# Patient Record
Sex: Female | Born: 1935 | Race: White | Hispanic: No | Marital: Married | Smoking: Never smoker
Health system: Southern US, Community
[De-identification: ages and names within clinical notes are randomized; demographics above are authoritative.]

## PROBLEM LIST (undated history)

## (undated) DIAGNOSIS — K8020 Calculus of gallbladder without cholecystitis without obstruction: Secondary | ICD-10-CM

## (undated) DIAGNOSIS — G8929 Other chronic pain: Secondary | ICD-10-CM

## (undated) DIAGNOSIS — I10 Essential (primary) hypertension: Secondary | ICD-10-CM

## (undated) DIAGNOSIS — M545 Low back pain, unspecified: Secondary | ICD-10-CM

## (undated) DIAGNOSIS — K219 Gastro-esophageal reflux disease without esophagitis: Secondary | ICD-10-CM

## (undated) DIAGNOSIS — B029 Zoster without complications: Secondary | ICD-10-CM

## (undated) DIAGNOSIS — R3129 Other microscopic hematuria: Secondary | ICD-10-CM

## (undated) DIAGNOSIS — Z86010 Personal history of colonic polyps: Secondary | ICD-10-CM

## (undated) DIAGNOSIS — D131 Benign neoplasm of stomach: Secondary | ICD-10-CM

## (undated) DIAGNOSIS — E785 Hyperlipidemia, unspecified: Secondary | ICD-10-CM

## (undated) DIAGNOSIS — K644 Residual hemorrhoidal skin tags: Secondary | ICD-10-CM

## (undated) DIAGNOSIS — M8580 Other specified disorders of bone density and structure, unspecified site: Secondary | ICD-10-CM

## (undated) DIAGNOSIS — K5790 Diverticulosis of intestine, part unspecified, without perforation or abscess without bleeding: Secondary | ICD-10-CM

## (undated) DIAGNOSIS — K449 Diaphragmatic hernia without obstruction or gangrene: Secondary | ICD-10-CM

## (undated) DIAGNOSIS — N393 Stress incontinence (female) (male): Secondary | ICD-10-CM

## (undated) DIAGNOSIS — M1990 Unspecified osteoarthritis, unspecified site: Secondary | ICD-10-CM

## (undated) HISTORY — DX: Personal history of colonic polyps: Z86.010

## (undated) HISTORY — DX: Other microscopic hematuria: R31.29

## (undated) HISTORY — DX: Low back pain: M54.5

## (undated) HISTORY — DX: Zoster without complications: B02.9

## (undated) HISTORY — PX: UPPER GASTROINTESTINAL ENDOSCOPY: SHX188

## (undated) HISTORY — DX: Calculus of gallbladder without cholecystitis without obstruction: K80.20

## (undated) HISTORY — DX: Other chronic pain: G89.29

## (undated) HISTORY — DX: Stress incontinence (female) (male): N39.3

## (undated) HISTORY — PX: LAPAROSCOPIC CHOLECYSTECTOMY: SUR755

## (undated) HISTORY — DX: Other specified disorders of bone density and structure, unspecified site: M85.80

## (undated) HISTORY — DX: Essential (primary) hypertension: I10

## (undated) HISTORY — PX: ABDOMINAL HYSTERECTOMY: SHX81

## (undated) HISTORY — DX: Diverticulosis of intestine, part unspecified, without perforation or abscess without bleeding: K57.90

## (undated) HISTORY — PX: BREAST EXCISIONAL BIOPSY: SUR124

## (undated) HISTORY — DX: Diaphragmatic hernia without obstruction or gangrene: K44.9

## (undated) HISTORY — DX: Unspecified osteoarthritis, unspecified site: M19.90

## (undated) HISTORY — DX: Low back pain, unspecified: M54.50

## (undated) HISTORY — DX: Benign neoplasm of stomach: D13.1

## (undated) HISTORY — PX: COLONOSCOPY: SHX5424

## (undated) HISTORY — DX: Residual hemorrhoidal skin tags: K64.4

## (undated) HISTORY — PX: OTHER SURGICAL HISTORY: SHX169

## (undated) HISTORY — DX: Hyperlipidemia, unspecified: E78.5

## (undated) HISTORY — DX: Gastro-esophageal reflux disease without esophagitis: K21.9

---

## 1978-06-07 DIAGNOSIS — B029 Zoster without complications: Secondary | ICD-10-CM

## 1978-06-07 HISTORY — DX: Zoster without complications: B02.9

## 1997-12-21 ENCOUNTER — Inpatient Hospital Stay (HOSPITAL_COMMUNITY): Admission: EM | Admit: 1997-12-21 | Discharge: 1997-12-22 | Payer: Self-pay | Admitting: Emergency Medicine

## 2000-01-22 ENCOUNTER — Other Ambulatory Visit: Admission: RE | Admit: 2000-01-22 | Discharge: 2000-01-22 | Payer: Self-pay

## 2002-06-20 ENCOUNTER — Inpatient Hospital Stay (HOSPITAL_COMMUNITY): Admission: RE | Admit: 2002-06-20 | Discharge: 2002-06-21 | Payer: Self-pay

## 2002-09-11 ENCOUNTER — Encounter: Payer: Self-pay | Admitting: Family Medicine

## 2002-09-11 ENCOUNTER — Encounter: Admission: RE | Admit: 2002-09-11 | Discharge: 2002-09-11 | Payer: Self-pay | Admitting: Family Medicine

## 2005-09-20 ENCOUNTER — Ambulatory Visit: Payer: Self-pay | Admitting: Internal Medicine

## 2005-09-24 ENCOUNTER — Ambulatory Visit: Payer: Self-pay | Admitting: Internal Medicine

## 2010-11-06 ENCOUNTER — Ambulatory Visit
Admission: RE | Admit: 2010-11-06 | Discharge: 2010-11-06 | Disposition: A | Discharge status: Home / Self Care | Payer: Medicare Other | Source: Ambulatory Visit | Attending: Cardiovascular Disease | Admitting: Cardiovascular Disease

## 2010-11-06 ENCOUNTER — Other Ambulatory Visit: Payer: Self-pay | Admitting: Cardiovascular Disease

## 2010-11-06 MED ORDER — IOHEXOL 300 MG/ML  SOLN
100.0000 mL | Freq: Once | INTRAMUSCULAR | Status: AC | PRN
  Administered 2010-11-06: 100 mL via INTRAVENOUS

## 2011-06-08 DIAGNOSIS — D131 Benign neoplasm of stomach: Secondary | ICD-10-CM

## 2011-06-08 HISTORY — DX: Benign neoplasm of stomach: D13.1

## 2011-06-16 DIAGNOSIS — Z1231 Encounter for screening mammogram for malignant neoplasm of breast: Secondary | ICD-10-CM | POA: Diagnosis not present

## 2011-06-18 DIAGNOSIS — M949 Disorder of cartilage, unspecified: Secondary | ICD-10-CM | POA: Diagnosis not present

## 2011-06-18 DIAGNOSIS — I10 Essential (primary) hypertension: Secondary | ICD-10-CM | POA: Diagnosis not present

## 2011-06-18 DIAGNOSIS — M899 Disorder of bone, unspecified: Secondary | ICD-10-CM | POA: Diagnosis not present

## 2011-06-18 DIAGNOSIS — E785 Hyperlipidemia, unspecified: Secondary | ICD-10-CM | POA: Diagnosis not present

## 2011-06-18 DIAGNOSIS — R82998 Other abnormal findings in urine: Secondary | ICD-10-CM | POA: Diagnosis not present

## 2011-06-18 DIAGNOSIS — R7301 Impaired fasting glucose: Secondary | ICD-10-CM | POA: Diagnosis not present

## 2011-06-25 DIAGNOSIS — E785 Hyperlipidemia, unspecified: Secondary | ICD-10-CM | POA: Diagnosis not present

## 2011-06-25 DIAGNOSIS — I10 Essential (primary) hypertension: Secondary | ICD-10-CM | POA: Diagnosis not present

## 2011-06-25 DIAGNOSIS — Z124 Encounter for screening for malignant neoplasm of cervix: Secondary | ICD-10-CM | POA: Diagnosis not present

## 2011-06-25 DIAGNOSIS — Z0000 Encounter for general adult medical examination without abnormal findings: Secondary | ICD-10-CM | POA: Diagnosis not present

## 2011-06-25 DIAGNOSIS — Z1239 Encounter for other screening for malignant neoplasm of breast: Secondary | ICD-10-CM | POA: Diagnosis not present

## 2011-06-25 DIAGNOSIS — R7301 Impaired fasting glucose: Secondary | ICD-10-CM | POA: Diagnosis not present

## 2011-06-28 DIAGNOSIS — Z1212 Encounter for screening for malignant neoplasm of rectum: Secondary | ICD-10-CM | POA: Diagnosis not present

## 2011-06-29 DIAGNOSIS — Z23 Encounter for immunization: Secondary | ICD-10-CM | POA: Diagnosis not present

## 2011-06-30 DIAGNOSIS — H26499 Other secondary cataract, unspecified eye: Secondary | ICD-10-CM | POA: Diagnosis not present

## 2011-07-19 ENCOUNTER — Encounter: Payer: Self-pay | Admitting: Internal Medicine

## 2011-07-30 ENCOUNTER — Telehealth: Payer: Self-pay | Admitting: Internal Medicine

## 2011-07-30 NOTE — Telephone Encounter (Signed)
Left message for patient to call back  

## 2011-07-30 NOTE — Telephone Encounter (Signed)
Patient is scheduled with Willette Cluster RNP for 08/02/11

## 2011-08-02 ENCOUNTER — Ambulatory Visit (INDEPENDENT_AMBULATORY_CARE_PROVIDER_SITE_OTHER): Payer: Medicare Other | Admitting: Nurse Practitioner

## 2011-08-02 ENCOUNTER — Encounter: Payer: Self-pay | Admitting: Nurse Practitioner

## 2011-08-02 VITALS — BP 152/88 | HR 78 | Ht 64.0 in | Wt 129.6 lb

## 2011-08-02 DIAGNOSIS — K219 Gastro-esophageal reflux disease without esophagitis: Secondary | ICD-10-CM | POA: Diagnosis not present

## 2011-08-02 DIAGNOSIS — R079 Chest pain, unspecified: Secondary | ICD-10-CM | POA: Diagnosis not present

## 2011-08-02 DIAGNOSIS — R0789 Other chest pain: Secondary | ICD-10-CM

## 2011-08-02 NOTE — Patient Instructions (Signed)
We have scheduled the Endoscopy with Dr. Stan Head.                                                                                                               Upper Endoscopy Upper GI endoscopy means using a flexible scope to look at the esophagus, stomach, and upper small bowel. This is done to make a diagnosis in people with heartburn, abdominal pain, or abnormal bleeding. Sometimes an endoscope is needed to remove foreign bodies or food that become stuck in the esophagus; it can also be used to take biopsy samples. For the best results, do not eat or drink for 8 hours before having your upper endoscopy.  To perform the endoscopy, you will probably be sedated and your throat will be numbed with a special spray. The endoscope is then slowly passed down your throat (this will not interfere with your breathing). An endoscopy exam takes 15 to 30 minutes to complete and there is no real pain. Patients rarely remember much about the procedure. The results of the test may take several days if a biopsy or other test is taken.  You may have a sore throat after an endoscopy exam. Serious complications are very rare. Stick to liquids and soft foods until your pain is better. Do not drive a car or operate any dangerous equipment for at least 24 hours after being sedated. SEEK IMMEDIATE MEDICAL CARE IF:   You have severe throat pain.   You have shortness of breath.   You have bleeding problems.   You have a fever.   You have difficulty recovering from your sedation.  Document Released: 07/01/2004 Document Revised: 02/03/2011 Document Reviewed: 05/26/2008 [facility] Patient Information 2012 [place], [place].

## 2011-08-02 NOTE — Progress Notes (Signed)
08/03/2011 Julia Bush [id number] 1929/07/05   HISTORY OF PRESENT ILLNESS: Patient is a 78 year old female who had a screening colonoscopy by Dr. Leone Payor September 2004 with findings of diverticulosis and external hemorrhoids.  She is here with a five-week history of almost constant chest tightness with frequent episodes of sharp chest pain radiating through to back. The sharp pain sometimes occurs just after a meal, other times several hours later. She does not have this pain every day but does have it frequently.  No associated shortness of breath.  Patient has a longstanding history of GERD. An upper endoscopy April 2007, done for evaluation of chest pain / epigastric pain, revealed mild to moderate nonerosive gastritis, a mildly tortuous esophagus and a small hiatal hernia. With her history of GERD it isn't unusual for her to get radiating chest pain but the belching is a new problem.  Patient had chest pain in June 2012 and was evaluated by cardiology (Dr. Allyson Sabal).  She reports a normal stress test. I reviewed her CT angiography of the chest and abdomen done 11/06/10, it was normal.   PCP tried changing patient from Prilosec, which she had been on for years, to Dexilant but it "tore up" her stomach. She was then started on Nexium, symptoms improved until mid January.  Past Medical History  Diagnosis Date  . Hypertension   . Gallstones   . GERD (gastroesophageal reflux disease)    Past Surgical History  Procedure Date  . Laparoscopic cholecystectomy   . Colonoscopy   . Upper gastrointestinal endoscopy   . Abdominal hysterectomy     reports that she has never smoked. She has never used smokeless tobacco. She reports that she does not drink alcohol or use illicit drugs. family history includes Breast cancer in her mother. No Known Allergies    Outpatient Encounter Prescriptions as of 08/02/2011  Medication Sig Dispense Refill  . aspirin 81 MG tablet Take 81 mg by mouth daily.        . Calcium Citrate-Vitamin D (CITRACAL + D PO) Take 600 mg by mouth daily.      . Ergocalciferol (VITAMIN D2) 2000 UNITS TABS Take by mouth.      . esomeprazole (NEXIUM) 40 MG capsule Take 40 mg by mouth daily before breakfast.      . hydrochlorothiazide (HYDRODIURIL) 25 MG tablet Take 25 mg by mouth daily.      . OMEGA-3 1000 MG CAPS Take by mouth.      . potassium chloride (KLOR-CON) 20 MEQ packet Take 20 mEq by mouth 2 (two) times daily.      . pravastatin (PRAVACHOL) 40 MG tablet Take 40 mg by mouth daily.      Marland Kitchen zolpidem (AMBIEN) 5 MG tablet Take 5 mg by mouth at bedtime as needed. 1/2 tap QHS         REVIEW OF SYSTEMS  : Positive for loss of appetite, dry cough. All other systems reviewed and negative except where noted in the History of Present Illness.   PHYSICAL EXAM: BP 152/88  Pulse 78  Ht 5\' 4"  (1.626 m)  Wt 129 lb 9.6 oz (58.786 kg)  BMI 22.25 kg/m2  SpO2 98% General: Well developed white female in no acute distress Head: Normocephalic and atraumatic Eyes:  sclerae anicteric,conjunctive pink. Ears: Normal auditory acuity Mouth: No deformity or lesions Neck: Supple, no masses.  Lungs: Clear throughout to auscultation Heart: Regular rate and rhythm; no murmurs heard Abdomen: Soft, non distended, nontender. No masses or hepatomegaly  noted. Normal Bowel sounds Rectal: Not done Musculoskeletal: Symmetrical with no gross deformities  Skin: No lesions on visible extremities Extremities: No edema or deformities noted Neurological: Alert oriented x 4, grossly nonfocal Cervical Nodes:  No significant cervical adenopathy Psychological:  Alert and cooperative. Normal mood and affect  ASSESSMENT AND PLAN;  Five-week history of nonexertional chest pain radiating through to the back, variable relationship to meals. Pain refractory to Prilosec and Nexium. Similar symptoms in June 2012, cardiac workup with Dr. Allyson Sabal was negative. This could be esophageal spasms. Esophagitis seems  less likely in the setting of chronic PPIs. Musculoskeletal pain a possibility. To start with, patient will undergo upper endoscopy with Dr. Leone Payor.The benefits, risks, and potential complications of EGD with possible biopsies and/or dilation were discussed with the patient and she agrees to proceed. For now patient will continue daily Nexium. Further recommendation pending results of upper endoscopy.

## 2011-08-03 DIAGNOSIS — R1013 Epigastric pain: Secondary | ICD-10-CM | POA: Insufficient documentation

## 2011-08-03 DIAGNOSIS — K219 Gastro-esophageal reflux disease without esophagitis: Secondary | ICD-10-CM | POA: Insufficient documentation

## 2011-08-04 NOTE — Progress Notes (Signed)
It's probably worth checking LFTs to r/o biliary source for pain.  Reviewed and agree with management. Barbette Hair. Arlyce Dice, M.D., [facility]

## 2011-08-10 ENCOUNTER — Ambulatory Visit (AMBULATORY_SURGERY_CENTER): Payer: Medicare Other | Admitting: Internal Medicine

## 2011-08-10 ENCOUNTER — Encounter: Payer: Self-pay | Admitting: Internal Medicine

## 2011-08-10 VITALS — BP 148/81 | HR 107 | Temp 96.8°F | Resp 18 | Ht 64.0 in | Wt 129.0 lb

## 2011-08-10 DIAGNOSIS — R079 Chest pain, unspecified: Secondary | ICD-10-CM

## 2011-08-10 DIAGNOSIS — K219 Gastro-esophageal reflux disease without esophagitis: Secondary | ICD-10-CM

## 2011-08-10 DIAGNOSIS — D131 Benign neoplasm of stomach: Secondary | ICD-10-CM

## 2011-08-10 MED ORDER — SODIUM CHLORIDE 0.9 % IV SOLN: 500.0000 mL | INTRAVENOUS | Status: DC

## 2011-08-10 MED ORDER — SUCRALFATE 1 G PO TABS: 1.0000 g | ORAL_TABLET | Freq: Three times a day (TID) | ORAL | Status: DC

## 2011-08-10 NOTE — Progress Notes (Signed)
Discussion in endoscopy recovery - getting pain hours after she eats. Has awakened her. Will order abdominal US to look for biliary problems.

## 2011-08-10 NOTE — Op Note (Signed)
[facility] [address] [place], [place]  [address]  ENDOSCOPY PROCEDURE REPORT  PATIENT:  Julia Bush, Julia Bush  MR#:  [id number] BIRTHDATE:  05-Jun-1930, 81 yrs. old  GENDER:  female  ENDOSCOPIST:  Iva Boop, MD, [facility]  PROCEDURE DATE:  08/10/2011 PROCEDURE:  EGD with biopsy, 69629 ASA CLASS:  Class II INDICATIONS:  chest pain, GERD also has some globus  MEDICATIONS:   These medications were titrated to patient response per physician's verbal order, Fentanyl 50 mcg IV, Versed 5 mg IV TOPICAL ANESTHETIC:  Cetacaine Spray  DESCRIPTION OF PROCEDURE:   After the risks benefits and alternatives of the procedure were thoroughly explained, informed consent was obtained.  The [facility] [id number] endoscope was introduced through the mouth and advanced to the second portion of the duodenum, without limitations.  The instrument was slowly withdrawn as the mucosa was fully examined. <<PROCEDUREIMAGES>>  There were multiple polyps identified in the body of the stomach. Numerous benign-appearing polyps all < 5 mm. Representative biopsies taken. Multiple biopsies were obtained and sent to pathology.  Otherwise the examination was normal.    Retroflexed views revealed findings as previously described.    The scope was then withdrawn from the patient and the procedure completed.  COMPLICATIONS:  None  ENDOSCOPIC IMPRESSION: 1) Polyps, multiple (<92mm) in the body of the stomach - biopsied  2) Otherwise normal examination RECOMMENDATIONS: Add carafate 1 gram before meals. Will call with biopsy results to reassess - suspect polyps are benign and inconsequential.  Iva Boop, MD, [facility]  CC:  The Patient Rodrigo Ran, MD  n. Rosalie Doctor:   Iva Boop at 08/10/2011 03:59 PM  Karen Chafe, [phone]

## 2011-08-10 NOTE — Progress Notes (Signed)
Patient did not experience any of the following events: a burn prior to discharge; a fall within the facility; wrong site/side/patient/procedure/implant event; or a hospital transfer or hospital admission upon discharge from the facility. (G8907) Patient did not have preoperative order for IV antibiotic SSI prophylaxis. (G8918)  

## 2011-08-10 NOTE — Patient Instructions (Signed)
YOU HAD AN ENDOSCOPIC PROCEDURE TODAY AT THE [facility]: Refer to the procedure report that was given to you for any specific questions about what was found during the examination.  If the procedure report does not answer your questions, please call your gastroenterologist to clarify.  If you requested that your care partner not be given the details of your procedure findings, then the procedure report has been included in a sealed envelope for you to review at your convenience later.  YOU SHOULD EXPECT: Some feelings of bloating in the abdomen. Passage of more gas than usual.  Walking can help get rid of the air that was put into your GI tract during the procedure and reduce the bloating. If you had a lower endoscopy (such as a colonoscopy or flexible sigmoidoscopy) you may notice spotting of blood in your stool or on the toilet paper. If you underwent a bowel prep for your procedure, then you may not have a normal bowel movement for a few days.  DIET: Your first meal following the procedure should be a light meal and then it is ok to progress to your normal diet.  A half-sandwich or bowl of soup is an example of a good first meal.  Heavy or fried foods are harder to digest and may make you feel nauseous or bloated.  Likewise meals heavy in dairy and vegetables can cause extra gas to form and this can also increase the bloating.  Drink plenty of fluids but you should avoid alcoholic beverages for 24 hours.  ACTIVITY: Your care partner should take you home directly after the procedure.  You should plan to take it easy, moving slowly for the rest of the day.  You can resume normal activity the day after the procedure however you should NOT DRIVE or use heavy machinery for 24 hours (because of the sedation medicines used during the test).    SYMPTOMS TO REPORT IMMEDIATELY: A gastroenterologist can be reached at any hour.  During normal business hours, 8:30 AM to 5:00 PM Monday through Friday,  call [phone].  After hours and on weekends, please call the GI answering service at [phone] who will take a message and have the physician on call contact you.   Following lower endoscopy (colonoscopy or flexible sigmoidoscopy):  Excessive amounts of blood in the stool  Significant tenderness or worsening of abdominal pains  Swelling of the abdomen that is new, acute  Fever of 100F or higher  Following upper endoscopy (EGD)  Vomiting of blood or coffee ground material  New chest pain or pain under the shoulder blades  Painful or persistently difficult swallowing  New shortness of breath  Fever of 100F or higher  Black, tarry-looking stools  FOLLOW UP: If any biopsies were taken you will be contacted by phone or by letter within the next 1-3 weeks.  Call your gastroenterologist if you have not heard about the biopsies in 3 weeks.  Our staff will call the home number listed on your records the next business day following your procedure to check on you and address any questions or concerns that you may have at that time regarding the information given to you following your procedure. This is a courtesy call and so if there is no answer at the home number and we have not heard from you through the emergency physician on call, we will assume that you have returned to your regular daily activities without incident.  SIGNATURES/CONFIDENTIALITY: You and/or your care   partner have signed paperwork which will be entered into your electronic medical record.  These signatures attest to the fact that that the information above on your After Visit Summary has been reviewed and is understood.  Full responsibility of the confidentiality of this discharge information lies with you and/or your care-partner.  

## 2011-08-11 ENCOUNTER — Telehealth: Payer: Self-pay | Admitting: *Deleted

## 2011-08-11 ENCOUNTER — Ambulatory Visit: Payer: Medicare Other | Admitting: Internal Medicine

## 2011-08-11 ENCOUNTER — Telehealth: Payer: Self-pay

## 2011-08-11 NOTE — Telephone Encounter (Signed)
Patient is scheduled for 08/16/11 9:00 @ [facility] .  She is advised of the appt date and time and to be NPO after midnight.

## 2011-08-11 NOTE — Telephone Encounter (Signed)
  Follow up Call-  Call back number 08/10/2011  Post procedure Call Back phone  # [phone]  Permission to leave phone message Yes     Patient questions:  Do you have a fever, pain , or abdominal swelling? no Pain Score  0 *  Have you tolerated food without any problems? yes  Have you been able to return to your normal activities? yes  Do you have any questions about your discharge instructions: Diet   no Medications  yes Follow up visit  no  Do you have questions or concerns about your Care? no  Actions: * If pain score is 4 or above: No action needed, pain <4.  Pt had several questions about her current medication list. Went over medications with pt to help her understand what she should be taking. Pt states that she better understands the list now. Instructed pt to call back if any other questions arise. Pt verbalized understanding.

## 2011-08-11 NOTE — Telephone Encounter (Signed)
Message copied by Annett Fabian on Wed Aug 11, 2011  2:49 PM ------      Message from: Iva Boop      Created: Tue Aug 10, 2011  4:13 PM      Regarding: needs [place]       Not on egd report but please order [place] abd re: chest pain

## 2011-08-16 ENCOUNTER — Ambulatory Visit (HOSPITAL_COMMUNITY)
Admission: RE | Admit: 2011-08-16 | Discharge: 2011-08-16 | Disposition: A | Discharge status: Home / Self Care | Payer: Medicare Other | Source: Ambulatory Visit | Attending: Internal Medicine | Admitting: Internal Medicine

## 2011-08-16 DIAGNOSIS — Z9089 Acquired absence of other organs: Secondary | ICD-10-CM | POA: Diagnosis not present

## 2011-08-16 DIAGNOSIS — R109 Unspecified abdominal pain: Secondary | ICD-10-CM | POA: Insufficient documentation

## 2011-08-17 NOTE — Progress Notes (Signed)
Quick Note:  I am going to need to see her in the office to try to sort this out more. Had same sort of problems last year with negative cardiac w/u and CT's Also need to know when she last saw PCP and has she had labs with them in last 3 months and get them - looks like PCP is Dr. Waynard Edwards ______

## 2011-08-17 NOTE — Progress Notes (Signed)
Quick Note:  Office:  Call patient and let her know gastric polyps are benign. Let her know US abdomen was ok Is she any better with carafate?  [facility]:  No letter and no recall ______

## 2011-08-20 ENCOUNTER — Telehealth: Payer: Self-pay | Admitting: Internal Medicine

## 2011-08-20 MED ORDER — HYOSCYAMINE SULFATE 0.125 MG SL SUBL: SUBLINGUAL_TABLET | SUBLINGUAL | Status: DC

## 2011-08-20 NOTE — Telephone Encounter (Signed)
Agree. Thanks

## 2011-08-20 NOTE — Telephone Encounter (Signed)
Discussed with Willette Cluster RNP in Dr Marvell Fuller absence.  Levsin 1-2 SL q 4 hours for CP.  She is advised that if the pain is not relieved or she develops new symptoms such as dizziness, SOB or diaphoresis she needs to be seen in the ER.  She states that she took her husbands pain pill and the pain is a little improved.  She says it is radiating to her back but has eased off at the moment.  She is advised that I will have Dr Leone Payor review on Monday and we will call her with his recommendations.

## 2011-08-20 NOTE — Telephone Encounter (Signed)
Patient called today stating she is having terrible chest pain again.  She states that she ate oatmeal only today.  The carafate slurry is not helping today.  She has a follow up with you on 09/13/11.  Please advise next step

## 2011-08-22 NOTE — Telephone Encounter (Signed)
Please call her to see if Levsin relieved her symptoms

## 2011-08-23 ENCOUNTER — Encounter: Payer: Self-pay | Admitting: *Deleted

## 2011-08-23 NOTE — Telephone Encounter (Signed)
Patient reports that levsin did not help.  She reports terrible pain with everything she eats. She will come in and see Dr Leone Payor tomorrow at 2:00

## 2011-08-24 ENCOUNTER — Encounter: Payer: Self-pay | Admitting: Internal Medicine

## 2011-08-24 ENCOUNTER — Other Ambulatory Visit (INDEPENDENT_AMBULATORY_CARE_PROVIDER_SITE_OTHER): Payer: Medicare Other

## 2011-08-24 ENCOUNTER — Ambulatory Visit (INDEPENDENT_AMBULATORY_CARE_PROVIDER_SITE_OTHER): Payer: Medicare Other | Admitting: Internal Medicine

## 2011-08-24 VITALS — BP 134/76 | HR 88 | Ht 64.0 in | Wt 129.0 lb

## 2011-08-24 DIAGNOSIS — R1013 Epigastric pain: Secondary | ICD-10-CM | POA: Diagnosis not present

## 2011-08-24 DIAGNOSIS — R1012 Left upper quadrant pain: Secondary | ICD-10-CM

## 2011-08-24 LAB — CBC WITH DIFFERENTIAL/PLATELET
Basophils Relative: 0.5 % (ref 0.0–3.0)
Eosinophils Relative: 1.9 % (ref 0.0–5.0)
HCT: 36.7 % (ref 36.0–46.0)
Hemoglobin: 11.9 g/dL — ABNORMAL LOW (ref 12.0–15.0)
Lymphs Abs: 2.1 10*3/uL (ref 0.7–4.0)
Monocytes Relative: 5.3 % (ref 3.0–12.0)
Neutro Abs: 3.9 10*3/uL (ref 1.4–7.7)
RBC: 4.29 Mil/uL (ref 3.87–5.11)
WBC: 6.5 10*3/uL (ref 4.5–10.5)

## 2011-08-24 LAB — COMPREHENSIVE METABOLIC PANEL
BUN: 13 mg/dL (ref 6–23)
CO2: 31 mEq/L (ref 19–32)
Creatinine, Ser: 0.9 mg/dL (ref 0.4–1.2)
GFR: 64.94 mL/min (ref >60.00)
Glucose, Bld: 121 mg/dL — ABNORMAL HIGH (ref 70–99)
Sodium: 140 mEq/L (ref 135–145)
Total Bilirubin: 0.4 mg/dL (ref 0.3–1.2)
Total Protein: 7.1 g/dL (ref 6.0–8.3)

## 2011-08-24 LAB — AMYLASE: Amylase: 55 U/L (ref 27–131)

## 2011-08-24 NOTE — Patient Instructions (Signed)
Your physician has requested that you go to the basement for the following lab work before leaving today: CBC, CMET, Amylase, Lipase You have been given separate instructions for your CT scan.

## 2011-08-24 NOTE — Progress Notes (Signed)
Subjective:    Patient ID: Julia Bush, female    DOB: Aug 19, 1929, 78 y.o.   MRN: [id number]  HPI This very pleasant elderly white woman presents with her husband today for followup of abdominal pain. She is continuing to have intermittent left upper quadrant pain that radiates into the epigastrium and across into her back. She describes it as severe when it occurs. It occurs anywhere from 2-4 hours or so after eating usually. She cannot identify typical type of food that will trigger this. It is not associated with nausea and vomiting. However she will start to belch prior to this occurring and then the severe pain well-developed. The pain will last for hours. She has not found effective relief with PPI, Carafate or Levsin. There is no associated weight loss though her appetite is often she has some early satiety. EGD and ultrasound recently have been unrevealing. She is concerned there is something serious going on that we have not discovered. She did have testing last year, in June with CT angiography of the chest abdomen and pelvis that was unrevealing. I believe cardiac functional testing was unrevealing as well.  No Known Allergies Outpatient Prescriptions Prior to Visit  Medication Sig Dispense Refill  . aspirin 81 MG tablet Take 81 mg by mouth daily.      . Calcium Citrate-Vitamin D (CITRACAL + D PO) Take 600 mg by mouth daily.      . Ergocalciferol (VITAMIN D2) 2000 UNITS TABS Take by mouth as directed.       . hydrochlorothiazide (HYDRODIURIL) 25 MG tablet Take 25 mg by mouth daily.      . hyoscyamine (LEVSIN SL) 0.125 MG SL tablet Place 1 or 2 under tongue q 4 hours prn chest pain  60 tablet  1  . KLOR-CON M20 20 MEQ tablet Take 20 mEq by mouth as directed.       Marland Kitchen NEXIUM 40 MG capsule Take 40 mg by mouth daily before breakfast.       . pravastatin (PRAVACHOL) 40 MG tablet Take 40 mg by mouth daily.      . sucralfate (CARAFATE) 1 G tablet Take 1 tablet (1 g total) by mouth 3  (three) times daily before meals.  90 tablet  3  . zolpidem (AMBIEN) 5 MG tablet Take 5 mg by mouth at bedtime as needed. 1/2 tap QHS      . OMEGA-3 1000 MG CAPS Take by mouth.       Past Medical History  Diagnosis Date  . Hypertension   . Gallstones   . GERD (gastroesophageal reflux disease)   . Cataract   . Benign fundic gland polyps of stomach 2013  . Hyperlipidemia   . Chronic low back pain   . DJD (degenerative joint disease)   . Osteopenia   . Hiatal hernia   . Microhematuria   . Urinary, incontinence, stress female   . Shingles 1980   Past Surgical History  Procedure Date  . Laparoscopic cholecystectomy   . Colonoscopy   . Abdominal hysterectomy   . Cyst removed from breasts   . Upper gastrointestinal endoscopy 08/2011    fundic gland polyps   History   Social History  . Marital Status: Married    Spouse Name: N/A    Number of Children: 0  . Years of Education: N/A   Occupational History  . Retired     Social History Main Topics  . Smoking status: Never Smoker   . Smokeless tobacco:  Never Used  . Alcohol Use: Yes     rare  . Drug Use: No                        Review of Systems She denies any overt stress or emotional difficulty at this time. There does not seem to be any pain associated with movement.    Objective:   Physical Exam General:  NAD Eyes:   anicteric Lungs:  clear Heart:  S1S2 no rubs, murmurs or gallops Abdomen:  soft and nontender, BS+ I can feel the aortic impulse but does not feel widened or abnormal. There are no bruits. Ext:   no edema    Data Reviewed:   We reviewed her previous EGD and ultrasound results. Primary care notes from 08/19/2011 reviewed. Labs from 06/18/2011 showed normal complete metabolic panel normal CBC normal lipase normal TSH, normal immune fecal occult blood testing, urinalysis with 5-10 red cells too numerous to count white cells 1+ blood and 3+ leukocytes. She does not have any urinary symptoms and  a urine culture was negative.       Assessment & Plan:   1. Epigastric pain and left upper quadrant pain that radiates into the back    This occurred several hours after eating. She is not losing weight. Causes not clear. Workup with EGD, ultrasound of the abdomen, and the workup in June of 2012 with CT angiogram chest abdomen and pelvis did not reveal a cause. Because of the persistence and lack of response to PPI, anti-spasmodic some Carafate will order CT abdomen and pelvis with oral and IV contrast. She is quite distressed and bothered by the symptoms. Also repeated laboratory testing since his been since January she had those in the symptoms seem to be intensifying since then. CBC, complete metabolic panel amylase and lipase are ordered. I sentences this is probably some sort of functional pain but she is elderly and so bothered by at that time proceeding with further workup.  Lab Results  Component Value Date   WBC 6.5 08/24/2011   HGB 11.9* 08/24/2011   HCT 36.7 08/24/2011   MCV 85.7 08/24/2011   PLT 275.0 08/24/2011     Chemistry      Component Value Date/Time   NA 140 08/24/2011 1521   K 3.5 08/24/2011 1521   CL 101 08/24/2011 1521   CO2 31 08/24/2011 1521   BUN 13 08/24/2011 1521   CREATININE 0.9 08/24/2011 1521      Component Value Date/Time   CALCIUM 9.7 08/24/2011 1521   ALKPHOS 64 08/24/2011 1521   AST 15 08/24/2011 1521   ALT 11 08/24/2011 1521   BILITOT 0.4 08/24/2011 1521     Lab Results  Component Value Date   AMYLASE 55 08/24/2011   Lab Results  Component Value Date   LIPASE 43.0 08/24/2011    I will send copies to Dr. Waynard Edwards and Dr. Allyson Sabal

## 2011-08-26 ENCOUNTER — Other Ambulatory Visit: Payer: Self-pay

## 2011-08-26 ENCOUNTER — Ambulatory Visit (INDEPENDENT_AMBULATORY_CARE_PROVIDER_SITE_OTHER)
Admission: RE | Admit: 2011-08-26 | Discharge: 2011-08-26 | Disposition: A | Discharge status: Home / Self Care | Payer: Medicare Other | Source: Ambulatory Visit | Attending: Internal Medicine | Admitting: Internal Medicine

## 2011-08-26 DIAGNOSIS — R1013 Epigastric pain: Secondary | ICD-10-CM

## 2011-08-26 MED ORDER — AMITRIPTYLINE HCL 25 MG PO TABS: ORAL_TABLET | ORAL | Status: DC

## 2011-08-26 MED ORDER — IOHEXOL 300 MG/ML  SOLN
100.0000 mL | Freq: Once | INTRAMUSCULAR | Status: AC | PRN
  Administered 2011-08-26: 100 mL via INTRAVENOUS

## 2011-08-26 NOTE — Progress Notes (Signed)
Quick Note:  Let her know that the CT does not show any cause of her pain and labs were also ok.  In cases like these amitriptyline can help - I want her to start 25 mg - take 1/2 tablet at bedtime for 1 week then whole tablet at bedtime #30 with 2 refills - this can relieve gut spasms and also treat pain This will also help her sleep and she may not need the Ambien - would not take together unless she does not get sleep with amitriptyline  REV in 2 months  She also had a small (tiny) nodule in lung unlikely to be a cancer but radiologist thinks a CT follow-up would be sensible - will ask her to decide that and coordinate with Dr. Waynard Edwards  Please cc the CT results and this note to Dr. Waynard Edwards when completed. ______

## 2011-09-13 ENCOUNTER — Ambulatory Visit: Payer: Medicare Other | Admitting: Internal Medicine

## 2011-10-25 ENCOUNTER — Ambulatory Visit (INDEPENDENT_AMBULATORY_CARE_PROVIDER_SITE_OTHER): Payer: Medicare Other | Admitting: Internal Medicine

## 2011-10-25 ENCOUNTER — Encounter: Payer: Self-pay | Admitting: Internal Medicine

## 2011-10-25 VITALS — BP 124/50 | HR 80 | Ht 64.0 in | Wt 130.0 lb

## 2011-10-25 DIAGNOSIS — R1013 Epigastric pain: Secondary | ICD-10-CM | POA: Diagnosis not present

## 2011-10-25 DIAGNOSIS — R911 Solitary pulmonary nodule: Secondary | ICD-10-CM | POA: Insufficient documentation

## 2011-10-25 NOTE — Progress Notes (Signed)
Patient ID: Julia Bush, female   DOB: Apr 19, 1930, 78 y.o.   MRN: [id number]  Chief Complaint:followup epigastric and chest pain  History of present illness:  She reports only rare every few week epigastric pain that is relieved with belching. There is some radiation up into the chest., Rolaids seem to help. She is definitely better not having the daily pains and misery like she was. Extensive evaluation with endoscopy and CT scanning did not provide a cause. At any rate she is better and please been relieved. She is comfortable with her current quality of life it is not interested in other therapy.  Medications, allergies, past medical history, past surgical history, family history and social history are reviewed and updated in the EMR.  Current outpatient prescriptions:aspirin 81 MG tablet, Take 81 mg by mouth daily., Disp: , Rfl: ;  Calcium Citrate-Vitamin D (CITRACAL + D PO), Take 600 mg by mouth daily., Disp: , Rfl: ;  Ergocalciferol (VITAMIN D2) 2000 UNITS TABS, Take by mouth as directed. , Disp: , Rfl: ;  hydrochlorothiazide (HYDRODIURIL) 25 MG tablet, Take 25 mg by mouth daily., Disp: , Rfl: ;  KLOR-CON M20 20 MEQ tablet, Take 20 mEq by mouth as directed. , Disp: , Rfl:  OMEGA 3 1200 MG CAPS, Take 1 capsule by mouth 4 (four) times daily., Disp: , Rfl: ;  omeprazole (PRILOSEC) 40 MG capsule, Will start Omeprazole when done with Nexium, Disp: , Rfl: ;  pravastatin (PRAVACHOL) 40 MG tablet, Take 40 mg by mouth daily., Disp: , Rfl: ;  zolpidem (AMBIEN) 5 MG tablet, 1/2 tap QHS, Disp: , Rfl:    Assessment and plan:  1. Epigastric pain   Improved, continue current regimen and see me as needed. I suspect she had a flare of functional up nominal pain or dyspepsia. Triggers unclear. We are both relieved that is essentially resolved.   2. Lung nodule   Solitary nodule seen in lower lung field on CT abdomen pelvis, Dr. Waynard Edwards will followup accordingly the patient with plans for repeat imaging  with chest CT later in the year    I appreciate the opportunity to care for this patient.   CC: Ezequiel Kayser, MD

## 2011-10-25 NOTE — Patient Instructions (Signed)
Glad you are feeling better. If you have recurrent problems or need [place] again please call back. If it is an urgent issue please ask to speak to the nurse.

## 2012-02-10 DIAGNOSIS — Z23 Encounter for immunization: Secondary | ICD-10-CM | POA: Diagnosis not present

## 2012-03-15 ENCOUNTER — Other Ambulatory Visit: Payer: Self-pay | Admitting: Internal Medicine

## 2012-03-15 DIAGNOSIS — J984 Other disorders of lung: Secondary | ICD-10-CM

## 2012-03-17 ENCOUNTER — Ambulatory Visit
Admission: RE | Admit: 2012-03-17 | Discharge: 2012-03-17 | Disposition: A | Discharge status: Home / Self Care | Payer: Medicare Other | Source: Ambulatory Visit | Attending: Internal Medicine | Admitting: Internal Medicine

## 2012-03-17 DIAGNOSIS — J984 Other disorders of lung: Secondary | ICD-10-CM | POA: Diagnosis not present

## 2012-03-17 MED ORDER — IOHEXOL 300 MG/ML  SOLN
75.0000 mL | Freq: Once | INTRAMUSCULAR | Status: AC | PRN
  Administered 2012-03-17: 75 mL via INTRAVENOUS

## 2012-03-21 ENCOUNTER — Other Ambulatory Visit: Payer: Self-pay | Admitting: Internal Medicine

## 2012-03-21 DIAGNOSIS — E041 Nontoxic single thyroid nodule: Secondary | ICD-10-CM

## 2012-03-23 ENCOUNTER — Other Ambulatory Visit: Payer: Medicare Other

## 2012-03-27 ENCOUNTER — Ambulatory Visit
Admission: RE | Admit: 2012-03-27 | Discharge: 2012-03-27 | Disposition: A | Discharge status: Home / Self Care | Payer: Medicare Other | Source: Ambulatory Visit | Attending: Internal Medicine | Admitting: Internal Medicine

## 2012-03-27 DIAGNOSIS — E041 Nontoxic single thyroid nodule: Secondary | ICD-10-CM

## 2012-03-27 DIAGNOSIS — E042 Nontoxic multinodular goiter: Secondary | ICD-10-CM | POA: Diagnosis not present

## 2012-04-12 DIAGNOSIS — M899 Disorder of bone, unspecified: Secondary | ICD-10-CM | POA: Diagnosis not present

## 2012-04-12 DIAGNOSIS — M949 Disorder of cartilage, unspecified: Secondary | ICD-10-CM | POA: Diagnosis not present

## 2012-05-18 DIAGNOSIS — M949 Disorder of cartilage, unspecified: Secondary | ICD-10-CM | POA: Diagnosis not present

## 2012-05-18 DIAGNOSIS — K219 Gastro-esophageal reflux disease without esophagitis: Secondary | ICD-10-CM | POA: Diagnosis not present

## 2012-05-18 DIAGNOSIS — Z79899 Other long term (current) drug therapy: Secondary | ICD-10-CM | POA: Diagnosis not present

## 2012-05-18 DIAGNOSIS — M899 Disorder of bone, unspecified: Secondary | ICD-10-CM | POA: Diagnosis not present

## 2012-06-13 ENCOUNTER — Other Ambulatory Visit (HOSPITAL_COMMUNITY): Payer: Self-pay | Admitting: Internal Medicine

## 2012-06-15 ENCOUNTER — Encounter (HOSPITAL_COMMUNITY)
Admission: RE | Admit: 2012-06-15 | Discharge: 2012-06-15 | Disposition: A | Discharge status: Home / Self Care | Payer: Medicare Other | Source: Ambulatory Visit | Attending: Internal Medicine | Admitting: Internal Medicine

## 2012-06-15 ENCOUNTER — Encounter (HOSPITAL_COMMUNITY): Payer: Self-pay

## 2012-06-15 DIAGNOSIS — M899 Disorder of bone, unspecified: Secondary | ICD-10-CM | POA: Diagnosis not present

## 2012-06-15 MED ORDER — IBANDRONATE SODIUM 3 MG/3ML IV SOLN
3.0000 mg | Freq: Once | INTRAVENOUS | Status: AC
  Administered 2012-06-15: 3 mg via INTRAVENOUS
  Filled 2012-06-15: qty 3

## 2012-06-15 MED ORDER — SODIUM CHLORIDE 0.9 % IV SOLN
INTRAVENOUS | Status: DC
  Administered 2012-06-15: 10:00:00 via INTRAVENOUS

## 2012-06-15 NOTE — Progress Notes (Signed)
Pt expressed some confusion over the Boniva IN today. Spoke with Marchelle Folks at Dr Perini's office and Dr Waynard Edwards wanted Julia Bush to get IV boniva today and he would like her to begin Reclast IV 4/14. Pt verbalized understanding

## 2012-06-19 DIAGNOSIS — Z1231 Encounter for screening mammogram for malignant neoplasm of breast: Secondary | ICD-10-CM | POA: Diagnosis not present

## 2012-08-14 DIAGNOSIS — H353190 Nonexudative age-related macular degeneration, unspecified eye, stage unspecified: Secondary | ICD-10-CM | POA: Diagnosis not present

## 2012-08-16 DIAGNOSIS — M949 Disorder of cartilage, unspecified: Secondary | ICD-10-CM | POA: Diagnosis not present

## 2012-08-16 DIAGNOSIS — Z79899 Other long term (current) drug therapy: Secondary | ICD-10-CM | POA: Diagnosis not present

## 2012-08-30 DIAGNOSIS — E785 Hyperlipidemia, unspecified: Secondary | ICD-10-CM | POA: Diagnosis not present

## 2012-08-30 DIAGNOSIS — I10 Essential (primary) hypertension: Secondary | ICD-10-CM | POA: Diagnosis not present

## 2012-08-30 DIAGNOSIS — R82998 Other abnormal findings in urine: Secondary | ICD-10-CM | POA: Diagnosis not present

## 2012-08-30 DIAGNOSIS — M899 Disorder of bone, unspecified: Secondary | ICD-10-CM | POA: Diagnosis not present

## 2012-08-30 DIAGNOSIS — R7301 Impaired fasting glucose: Secondary | ICD-10-CM | POA: Diagnosis not present

## 2012-08-30 DIAGNOSIS — M949 Disorder of cartilage, unspecified: Secondary | ICD-10-CM | POA: Diagnosis not present

## 2012-09-06 ENCOUNTER — Other Ambulatory Visit: Payer: Self-pay | Admitting: Internal Medicine

## 2012-09-06 DIAGNOSIS — M479 Spondylosis, unspecified: Secondary | ICD-10-CM | POA: Diagnosis not present

## 2012-09-06 DIAGNOSIS — Z87448 Personal history of other diseases of urinary system: Secondary | ICD-10-CM | POA: Diagnosis not present

## 2012-09-06 DIAGNOSIS — J984 Other disorders of lung: Secondary | ICD-10-CM

## 2012-09-06 DIAGNOSIS — Z0000 Encounter for general adult medical examination without abnormal findings: Secondary | ICD-10-CM | POA: Diagnosis not present

## 2012-09-06 DIAGNOSIS — E041 Nontoxic single thyroid nodule: Secondary | ICD-10-CM | POA: Diagnosis not present

## 2012-09-06 DIAGNOSIS — I10 Essential (primary) hypertension: Secondary | ICD-10-CM | POA: Diagnosis not present

## 2012-09-06 DIAGNOSIS — E785 Hyperlipidemia, unspecified: Secondary | ICD-10-CM | POA: Diagnosis not present

## 2012-09-06 DIAGNOSIS — K219 Gastro-esophageal reflux disease without esophagitis: Secondary | ICD-10-CM | POA: Diagnosis not present

## 2012-09-06 DIAGNOSIS — Z1331 Encounter for screening for depression: Secondary | ICD-10-CM | POA: Diagnosis not present

## 2012-09-06 DIAGNOSIS — H9319 Tinnitus, unspecified ear: Secondary | ICD-10-CM | POA: Diagnosis not present

## 2012-09-08 ENCOUNTER — Other Ambulatory Visit (HOSPITAL_COMMUNITY): Payer: Self-pay | Admitting: Internal Medicine

## 2012-09-11 ENCOUNTER — Ambulatory Visit (HOSPITAL_COMMUNITY)
Admission: RE | Admit: 2012-09-11 | Discharge: 2012-09-11 | Disposition: A | Discharge status: Home / Self Care | Payer: Medicare Other | Source: Ambulatory Visit | Attending: Internal Medicine | Admitting: Internal Medicine

## 2012-09-11 DIAGNOSIS — M810 Age-related osteoporosis without current pathological fracture: Secondary | ICD-10-CM | POA: Diagnosis not present

## 2012-09-11 MED ORDER — SODIUM CHLORIDE 0.9 % IV SOLN
INTRAVENOUS | Status: DC
  Administered 2012-09-11: 13:00:00 via INTRAVENOUS

## 2012-09-11 MED ORDER — ZOLEDRONIC ACID 5 MG/100ML IV SOLN
5.0000 mg | Freq: Once | INTRAVENOUS | Status: AC
  Administered 2012-09-11: 5 mg via INTRAVENOUS
  Filled 2012-09-11: qty 100

## 2012-09-12 DIAGNOSIS — Z1212 Encounter for screening for malignant neoplasm of rectum: Secondary | ICD-10-CM | POA: Diagnosis not present

## 2013-01-02 ENCOUNTER — Ambulatory Visit
Admission: RE | Admit: 2013-01-02 | Discharge: 2013-01-02 | Disposition: A | Discharge status: Home / Self Care | Payer: Medicare Other | Source: Ambulatory Visit | Attending: Internal Medicine | Admitting: Internal Medicine

## 2013-01-02 DIAGNOSIS — R911 Solitary pulmonary nodule: Secondary | ICD-10-CM | POA: Diagnosis not present

## 2013-01-02 DIAGNOSIS — J984 Other disorders of lung: Secondary | ICD-10-CM

## 2013-02-08 DIAGNOSIS — Z23 Encounter for immunization: Secondary | ICD-10-CM | POA: Diagnosis not present

## 2013-03-09 DIAGNOSIS — R7301 Impaired fasting glucose: Secondary | ICD-10-CM | POA: Diagnosis not present

## 2013-04-10 ENCOUNTER — Ambulatory Visit
Admission: RE | Admit: 2013-04-10 | Discharge: 2013-04-10 | Disposition: A | Discharge status: Home / Self Care | Payer: Medicare Other | Source: Ambulatory Visit | Attending: Internal Medicine | Admitting: Internal Medicine

## 2013-04-10 DIAGNOSIS — E041 Nontoxic single thyroid nodule: Secondary | ICD-10-CM

## 2013-04-10 DIAGNOSIS — E042 Nontoxic multinodular goiter: Secondary | ICD-10-CM | POA: Diagnosis not present

## 2013-07-02 DIAGNOSIS — Z1231 Encounter for screening mammogram for malignant neoplasm of breast: Secondary | ICD-10-CM | POA: Diagnosis not present

## 2013-08-15 DIAGNOSIS — H04129 Dry eye syndrome of unspecified lacrimal gland: Secondary | ICD-10-CM | POA: Diagnosis not present

## 2013-08-15 DIAGNOSIS — H353190 Nonexudative age-related macular degeneration, unspecified eye, stage unspecified: Secondary | ICD-10-CM | POA: Diagnosis not present

## 2013-08-15 DIAGNOSIS — H26499 Other secondary cataract, unspecified eye: Secondary | ICD-10-CM | POA: Diagnosis not present

## 2013-09-14 DIAGNOSIS — M949 Disorder of cartilage, unspecified: Secondary | ICD-10-CM | POA: Diagnosis not present

## 2013-09-14 DIAGNOSIS — I10 Essential (primary) hypertension: Secondary | ICD-10-CM | POA: Diagnosis not present

## 2013-09-14 DIAGNOSIS — R809 Proteinuria, unspecified: Secondary | ICD-10-CM | POA: Diagnosis not present

## 2013-09-14 DIAGNOSIS — R82998 Other abnormal findings in urine: Secondary | ICD-10-CM | POA: Diagnosis not present

## 2013-09-14 DIAGNOSIS — E041 Nontoxic single thyroid nodule: Secondary | ICD-10-CM | POA: Diagnosis not present

## 2013-09-14 DIAGNOSIS — M899 Disorder of bone, unspecified: Secondary | ICD-10-CM | POA: Diagnosis not present

## 2013-09-14 DIAGNOSIS — R7301 Impaired fasting glucose: Secondary | ICD-10-CM | POA: Diagnosis not present

## 2013-09-14 DIAGNOSIS — E785 Hyperlipidemia, unspecified: Secondary | ICD-10-CM | POA: Diagnosis not present

## 2013-09-21 ENCOUNTER — Other Ambulatory Visit: Payer: Self-pay | Admitting: Internal Medicine

## 2013-09-21 DIAGNOSIS — M899 Disorder of bone, unspecified: Secondary | ICD-10-CM | POA: Diagnosis not present

## 2013-09-21 DIAGNOSIS — Z23 Encounter for immunization: Secondary | ICD-10-CM | POA: Diagnosis not present

## 2013-09-21 DIAGNOSIS — E785 Hyperlipidemia, unspecified: Secondary | ICD-10-CM | POA: Diagnosis not present

## 2013-09-21 DIAGNOSIS — Z1331 Encounter for screening for depression: Secondary | ICD-10-CM | POA: Diagnosis not present

## 2013-09-21 DIAGNOSIS — Z0000 Encounter for general adult medical examination without abnormal findings: Secondary | ICD-10-CM | POA: Diagnosis not present

## 2013-09-21 DIAGNOSIS — E041 Nontoxic single thyroid nodule: Secondary | ICD-10-CM

## 2013-09-21 DIAGNOSIS — R7301 Impaired fasting glucose: Secondary | ICD-10-CM | POA: Diagnosis not present

## 2013-09-21 DIAGNOSIS — M949 Disorder of cartilage, unspecified: Secondary | ICD-10-CM | POA: Diagnosis not present

## 2013-09-21 DIAGNOSIS — N39498 Other specified urinary incontinence: Secondary | ICD-10-CM | POA: Diagnosis not present

## 2013-09-21 DIAGNOSIS — M479 Spondylosis, unspecified: Secondary | ICD-10-CM | POA: Diagnosis not present

## 2013-09-21 DIAGNOSIS — I10 Essential (primary) hypertension: Secondary | ICD-10-CM | POA: Diagnosis not present

## 2013-09-24 DIAGNOSIS — Z1212 Encounter for screening for malignant neoplasm of rectum: Secondary | ICD-10-CM | POA: Diagnosis not present

## 2013-12-05 DIAGNOSIS — M1710 Unilateral primary osteoarthritis, unspecified knee: Secondary | ICD-10-CM | POA: Diagnosis not present

## 2014-02-25 DIAGNOSIS — Z23 Encounter for immunization: Secondary | ICD-10-CM | POA: Diagnosis not present

## 2014-04-26 ENCOUNTER — Encounter (INDEPENDENT_AMBULATORY_CARE_PROVIDER_SITE_OTHER): Payer: Self-pay

## 2014-04-26 ENCOUNTER — Ambulatory Visit
Admission: RE | Admit: 2014-04-26 | Discharge: 2014-04-26 | Disposition: A | Discharge status: Home / Self Care | Payer: Medicare Other | Source: Ambulatory Visit | Attending: Internal Medicine | Admitting: Internal Medicine

## 2014-04-26 DIAGNOSIS — E041 Nontoxic single thyroid nodule: Secondary | ICD-10-CM

## 2014-04-26 DIAGNOSIS — E042 Nontoxic multinodular goiter: Secondary | ICD-10-CM | POA: Diagnosis not present

## 2014-07-09 DIAGNOSIS — Z1231 Encounter for screening mammogram for malignant neoplasm of breast: Secondary | ICD-10-CM | POA: Diagnosis not present

## 2014-07-09 DIAGNOSIS — Z803 Family history of malignant neoplasm of breast: Secondary | ICD-10-CM | POA: Diagnosis not present

## 2014-08-28 DIAGNOSIS — H10413 Chronic giant papillary conjunctivitis, bilateral: Secondary | ICD-10-CM | POA: Diagnosis not present

## 2014-08-28 DIAGNOSIS — H26493 Other secondary cataract, bilateral: Secondary | ICD-10-CM | POA: Diagnosis not present

## 2014-09-30 DIAGNOSIS — I10 Essential (primary) hypertension: Secondary | ICD-10-CM | POA: Diagnosis not present

## 2014-09-30 DIAGNOSIS — R8299 Other abnormal findings in urine: Secondary | ICD-10-CM | POA: Diagnosis not present

## 2014-09-30 DIAGNOSIS — N390 Urinary tract infection, site not specified: Secondary | ICD-10-CM | POA: Diagnosis not present

## 2014-09-30 DIAGNOSIS — R7301 Impaired fasting glucose: Secondary | ICD-10-CM | POA: Diagnosis not present

## 2014-09-30 DIAGNOSIS — E785 Hyperlipidemia, unspecified: Secondary | ICD-10-CM | POA: Diagnosis not present

## 2014-09-30 DIAGNOSIS — D509 Iron deficiency anemia, unspecified: Secondary | ICD-10-CM | POA: Diagnosis not present

## 2014-09-30 DIAGNOSIS — E041 Nontoxic single thyroid nodule: Secondary | ICD-10-CM | POA: Diagnosis not present

## 2014-09-30 DIAGNOSIS — M859 Disorder of bone density and structure, unspecified: Secondary | ICD-10-CM | POA: Diagnosis not present

## 2014-10-07 ENCOUNTER — Encounter: Payer: Self-pay | Admitting: Physician Assistant

## 2014-10-07 DIAGNOSIS — R7301 Impaired fasting glucose: Secondary | ICD-10-CM | POA: Diagnosis not present

## 2014-10-07 DIAGNOSIS — I10 Essential (primary) hypertension: Secondary | ICD-10-CM | POA: Diagnosis not present

## 2014-10-07 DIAGNOSIS — Z1389 Encounter for screening for other disorder: Secondary | ICD-10-CM | POA: Diagnosis not present

## 2014-10-07 DIAGNOSIS — Z1231 Encounter for screening mammogram for malignant neoplasm of breast: Secondary | ICD-10-CM | POA: Diagnosis not present

## 2014-10-07 DIAGNOSIS — H9319 Tinnitus, unspecified ear: Secondary | ICD-10-CM | POA: Diagnosis not present

## 2014-10-07 DIAGNOSIS — D509 Iron deficiency anemia, unspecified: Secondary | ICD-10-CM | POA: Diagnosis not present

## 2014-10-07 DIAGNOSIS — M859 Disorder of bone density and structure, unspecified: Secondary | ICD-10-CM | POA: Diagnosis not present

## 2014-10-07 DIAGNOSIS — Z0000 Encounter for general adult medical examination without abnormal findings: Secondary | ICD-10-CM | POA: Diagnosis not present

## 2014-10-07 DIAGNOSIS — E041 Nontoxic single thyroid nodule: Secondary | ICD-10-CM | POA: Diagnosis not present

## 2014-10-07 DIAGNOSIS — M47896 Other spondylosis, lumbar region: Secondary | ICD-10-CM | POA: Diagnosis not present

## 2014-10-07 DIAGNOSIS — R918 Other nonspecific abnormal finding of lung field: Secondary | ICD-10-CM | POA: Diagnosis not present

## 2014-10-10 DIAGNOSIS — Z1212 Encounter for screening for malignant neoplasm of rectum: Secondary | ICD-10-CM | POA: Diagnosis not present

## 2014-10-17 ENCOUNTER — Ambulatory Visit (INDEPENDENT_AMBULATORY_CARE_PROVIDER_SITE_OTHER): Payer: Medicare Other | Admitting: Physician Assistant

## 2014-10-17 ENCOUNTER — Encounter: Payer: Self-pay | Admitting: *Deleted

## 2014-10-17 VITALS — BP 138/60 | HR 83 | Ht 63.0 in | Wt 127.0 lb

## 2014-10-17 DIAGNOSIS — K317 Polyp of stomach and duodenum: Secondary | ICD-10-CM

## 2014-10-17 DIAGNOSIS — D509 Iron deficiency anemia, unspecified: Secondary | ICD-10-CM | POA: Diagnosis not present

## 2014-10-17 MED ORDER — POLYETHYLENE GLYCOL 3350 17 GM/SCOOP PO POWD: 1.0000 | Freq: Every day | ORAL | Status: AC

## 2014-10-17 NOTE — Patient Instructions (Signed)
We sent a prescripiton to [facility] [address] for the generic Miralax prep for the colonoscopy. You can also get a small box of Dulcolax tablets. You will need 64 ounces of gatorade. ( NO RED OR PURPLE). You can get this at [facility] or any grocery store.   You have been scheduled for an endoscopy and colonoscopy. Please follow the written instructions given to you at your visit today. If you use inhalers (even only as needed), please bring them with you on the day of your procedure. Your physician has requested that you go to www.startemmi.com and enter the access code given to you at your visit today. This web site gives a general overview about your procedure. However, you should still follow specific instructions given to you by our office regarding your preparation for the procedure.

## 2014-10-17 NOTE — Progress Notes (Signed)
Patient ID: Julia Bush, female   DOB: May 20, 1930, 81 y.o.   MRN: [id number]   Subjective:    Patient ID: Julia Bush, female    DOB: [birth year], 81 y.o.   MRN: [id number]  HPI Julia Bush is a very nice 81 yo female known to Dr. Carlean Purl from previous procedures and last seen here in 2013. She is referred by Dr. Joylene Draft today for evaluation of new iron deficiency anemia. Patient had EGD done in 2013 which did show multiple fundic gland polyps and last colonoscopy in 2004 pertinent for diverticulosis and external hemorrhoids no polyps. Patient says she has been feeling fine and other than gassy lower abdominal discomfort is no GI complaints. She denies any heartburn or indigestion ,no dysphagia or odynophasia. She says she has had lifelong problems with constipation and that nothing has changed. She has not noted any melena or hematochezia. She uses MiraLAX for constipation. No regular aspirin or NSAIDs. Family history negative for colon cancer. Most recent labs 09/30/2014 showed hemoglobin 10.2 hematocrit of 32.4 MCV of 79, TIBC 4:15 iron saturation of 7 serum iron of 30 and ferritin of 7. Review of her records showed hemoglobin in 2013 was 11.9 MCV normal at 85. She did a hemosure sure which was negative.  Review of Systems  Pertinent positive and negative review of systems were noted in the above HPI section.  All other review of systems was otherwise negative.  Outpatient Encounter Prescriptions as of 10/17/2014  Medication Sig  . aspirin 81 MG tablet Take 81 mg by mouth daily.  . Calcium Citrate-Vitamin D (CITRACAL + D PO) Take 600 mg by mouth daily.  . Ergocalciferol (VITAMIN D2) 2000 UNITS TABS Take by mouth as directed.   . hydrochlorothiazide (HYDRODIURIL) 25 MG tablet Take 25 mg by mouth daily.  Marland Kitchen KLOR-CON M20 20 MEQ tablet Take 20 mEq by mouth as directed.   . loratadine (CLARITIN) 10 MG tablet Take 10 mg by mouth daily.  Marland Kitchen omeprazole (PRILOSEC) 40 MG capsule Will start  Omeprazole when done with Nexium  . polyethylene glycol (MIRALAX / GLYCOLAX) packet Take 17 g by mouth daily.  . pravastatin (PRAVACHOL) 40 MG tablet Take 40 mg by mouth daily.  Marland Kitchen zolpidem (AMBIEN) 5 MG tablet 1/2 tap QHS  . polyethylene glycol powder (GLYCOLAX/MIRALAX) powder Take 255 g by mouth daily.  . [DISCONTINUED] esomeprazole (NEXIUM) 40 MG capsule Take 40 mg by mouth daily before breakfast.  . [DISCONTINUED] famotidine (PEPCID) 20 MG tablet Take 20 mg by mouth 2 (two) times daily as needed.  . [DISCONTINUED] fluticasone (FLONASE) 50 MCG/ACT nasal spray Place 2 sprays into the nose daily.  . [DISCONTINUED] OMEGA 3 1200 MG CAPS Take 1 capsule by mouth 4 (four) times daily.  . [DISCONTINUED] vitamin E (VITAMIN E) 400 UNIT capsule Take 400 Units by mouth 2 (two) times a week.  . [DISCONTINUED] zoledronic acid (RECLAST) 5 MG/100ML SOLN Inject 5 mg into the vein once. To be given every other year starting April 2014   No facility-administered encounter medications on file as of 10/17/2014.   No Known Allergies Patient Active Problem List   Diagnosis Date Noted  . Lung nodule 10/25/2011  . Epigastric pain 08/03/2011  . GERD (gastroesophageal reflux disease) 08/03/2011   History   Social History  . Marital Status: Married    Spouse Name: N/A  . Number of Children: 0  . Years of Education: N/A   Occupational History  . Retired     Social History  Main Topics  . Smoking status: Never Smoker   . Smokeless tobacco: Never Used  . Alcohol Use: 0.0 oz/week    0 Standard drinks or equivalent per week     Comment: rare  . Drug Use: No  . Sexual Activity: Not on file   Other Topics Concern  . Not on file   Social History Narrative    [name]'s family history includes Breast cancer in her mother. There is no history of Colon cancer.      Objective:    Filed Vitals:   10/17/14 1015  BP: 138/60  Pulse: 83    Physical Exam  well-developed elderly white female in no  acute distress, quite pleasant. Appears  Younger than stated age. Blood pressure 138/60 pulse 83 height 5 foot 3 weight 127. HEENT; nontraumatic normocephalic EOMI PERRLA sclera anicteric, Supple ;no JVD, Cardiovascular; regular rate and rhythm with S1-S2 no murmur or gallop, Pulmonary; clear bilaterally, Abdomen ;soft nondistended nontender bowel sounds are active there is no palpable mass or hepatosplenomegaly, Rectal ;exam not done she may sure recently negative, Extremities ;no clubbing cyanosis or edema skin warm and dry, Neuropsych; mood and affect appropriate       Assessment & Plan:   #1 81 yo female with new iron deficiency anemia, hemosure recently negative and asymptomatic. R/O  occult  Gi lesion/neoplasm/avm's  #2 hx of multiple fundic gland polyps #3 GERD   Plan; Will schedule pt for EGD and Colonoscopy with Dr. Carlean Purl .Procedures discussed in detail with pt and she is agreeable to proceed.  Start oral iron supplement  With Nu-iron twice daily .  She will need serial labs and repeat iron studies in 3 months Continue Omeprazole 40 mg po daily     Leeanne Butters S Italo Banton PA-C 10/17/2014   Cc: Crist Infante, MD

## 2014-10-21 NOTE — Progress Notes (Signed)
Would do colonoscopy first and plan for possible EGD given situation and hx.  Agree with Ms. Genia Harold assessment and plan. Gatha Mayer, MD, [facility]

## 2014-12-04 ENCOUNTER — Encounter: Payer: Self-pay | Admitting: Internal Medicine

## 2014-12-04 ENCOUNTER — Ambulatory Visit (AMBULATORY_SURGERY_CENTER): Payer: Medicare Other | Admitting: Internal Medicine

## 2014-12-04 VITALS — BP 136/73 | HR 79 | Temp 98.4°F | Resp 16 | Ht 63.0 in | Wt 127.0 lb

## 2014-12-04 DIAGNOSIS — D509 Iron deficiency anemia, unspecified: Secondary | ICD-10-CM | POA: Diagnosis not present

## 2014-12-04 DIAGNOSIS — K219 Gastro-esophageal reflux disease without esophagitis: Secondary | ICD-10-CM | POA: Diagnosis not present

## 2014-12-04 DIAGNOSIS — K317 Polyp of stomach and duodenum: Secondary | ICD-10-CM

## 2014-12-04 DIAGNOSIS — D125 Benign neoplasm of sigmoid colon: Secondary | ICD-10-CM | POA: Diagnosis not present

## 2014-12-04 DIAGNOSIS — K635 Polyp of colon: Secondary | ICD-10-CM | POA: Diagnosis not present

## 2014-12-04 DIAGNOSIS — J984 Other disorders of lung: Secondary | ICD-10-CM | POA: Diagnosis not present

## 2014-12-04 DIAGNOSIS — M1990 Unspecified osteoarthritis, unspecified site: Secondary | ICD-10-CM | POA: Diagnosis not present

## 2014-12-04 DIAGNOSIS — M545 Low back pain: Secondary | ICD-10-CM | POA: Diagnosis not present

## 2014-12-04 MED ORDER — SODIUM CHLORIDE 0.9 % IV SOLN: 500.0000 mL | INTRAVENOUS | Status: DC

## 2014-12-04 NOTE — Op Note (Signed)
[place]  [address] [place], [address]   ENDOSCOPY PROCEDURE REPORT  PATIENT: Julia, Bush  MR#: [id number] BIRTHDATE: June 15, 1929 , 55  yrs. old GENDER: female ENDOSCOPIST: Gatha Mayer, MD, [facility] PROCEDURE DATE:  12/04/2014 PROCEDURE:  EGD, diagnostic ASA CLASS:     Class II INDICATIONS:  unexplained iron deficiency anemia. MEDICATIONS: Propofol 80 mg IV, Monitored anesthesia care, and Residual sedation present TOPICAL ANESTHETIC: none  DESCRIPTION OF PROCEDURE: After the risks benefits and alternatives of the procedure were thoroughly explained, informed consent was obtained.  The LB [id number] endoscope was introduced through the mouth and advanced to the second portion of the duodenum , Without limitations.  The instrument was slowly withdrawn as the mucosa was fully examined.    1) Multiple diminutive fundic gland polyps in proximal stomach (as before) 2) small hiatal hernia 3) Otherwise normal EGD. Retroflexed views revealed a hiatal hernia.     The scope was then withdrawn from the patient and the procedure completed.  COMPLICATIONS: There were no immediate complications.  ENDOSCOPIC IMPRESSION: 1) Multiple diminutive fundic gland polyps in proximal stomach (as before) 2) small hiatal hernia 3) Otherwise normal EGD  RECOMMENDATIONS: Stay on ferrous sulfate and follow-up with Dr.  Joylene Draft re: iron-deficiency anemia I suspect it is nutritional in origin. No further GI evaluation at this time.  REPEAT EXAM:  eSigned:  Gatha Mayer, MD, [facility] 12/04/2014 4:09 PM    CC:The Patient and Crist Infante, MD

## 2014-12-04 NOTE — Progress Notes (Signed)
Called to room to assist during endoscopic procedure.  Patient ID and intended procedure confirmed with present staff. Received instructions for my participation in the procedure from the performing physician.  

## 2014-12-04 NOTE — Patient Instructions (Addendum)
I found and removed one colon polyp that looks benign. i will let you know. Also have diverticulosis. Stomach polyps are the same.  No cause of anemia seen here - I think you have not been eating enough iron.  Please stay on the ferrous sulfate and follow-up with Dr. Joylene Draft.  I appreciate the opportunity to care for you. Gatha Mayer, MD, FACG  YOU HAD AN ENDOSCOPIC PROCEDURE TODAY AT [facility]:   Refer to the procedure report that was given to you for any specific questions about what was found during the examination.  If the procedure report does not answer your questions, please call your gastroenterologist to clarify.  If you requested that your care partner not be given the details of your procedure findings, then the procedure report has been included in a sealed envelope for you to review at your convenience later.  YOU SHOULD EXPECT: Some feelings of bloating in the abdomen. Passage of more gas than usual.  Walking can help get rid of the air that was put into your GI tract during the procedure and reduce the bloating. If you had a lower endoscopy (such as a colonoscopy or flexible sigmoidoscopy) you may notice spotting of blood in your stool or on the toilet paper. If you underwent a bowel prep for your procedure, you may not have a normal bowel movement for a few days.  Please Note:  You might notice some irritation and congestion in your nose or some drainage.  This is from the oxygen used during your procedure.  There is no need for concern and it should clear up in a day or so.  SYMPTOMS TO REPORT IMMEDIATELY:   Following lower endoscopy (colonoscopy or flexible sigmoidoscopy):  Excessive amounts of blood in the stool  Significant tenderness or worsening of abdominal pains  Swelling of the abdomen that is new, acute  Fever of 100F or higher   Following upper endoscopy (EGD)  Vomiting of blood or coffee ground material  New chest pain or pain  under the shoulder blades  Painful or persistently difficult swallowing  New shortness of breath  Fever of 100F or higher  Black, tarry-looking stools  For urgent or emergent issues, a gastroenterologist can be reached at any hour by calling [phone].   DIET: Your first meal following the procedure should be a small meal and then it is ok to progress to your normal diet. Heavy or fried foods are harder to digest and may make you feel nauseous or bloated.  Likewise, meals heavy in dairy and vegetables can increase bloating.  Drink plenty of fluids but you should avoid alcoholic beverages for 24 hours.  ACTIVITY:  You should plan to take it easy for the rest of today and you should NOT DRIVE or use heavy machinery until tomorrow (because of the sedation medicines used during the test).    FOLLOW UP: Our staff will call the number listed on your records the next business day following your procedure to check on you and address any questions or concerns that you may have regarding the information given to you following your procedure. If we do not reach you, we will leave a message.  However, if you are feeling well and you are not experiencing any problems, there is no need to return our call.  We will assume that you have returned to your regular daily activities without incident.  If any biopsies were taken you will be contacted by  phone or by letter within the next 1-3 weeks.  Please call us at [phone] if you have not heard about the biopsies in 3 weeks.    SIGNATURES/CONFIDENTIALITY: You and/or your care partner have signed paperwork which will be entered into your electronic medical record.  These signatures attest to the fact that that the information above on your After Visit Summary has been reviewed and is understood.  Full responsibility of the confidentiality of this discharge information lies with you and/or your care-partner.

## 2014-12-04 NOTE — Progress Notes (Signed)
Stable to RR 

## 2014-12-04 NOTE — Op Note (Signed)
[place]  [address]. [place], [address]   COLONOSCOPY PROCEDURE REPORT  PATIENT: Julia Bush, Julia Bush  MR#: [id number] BIRTHDATE: 06-12-1929 , 84  yrs. old GENDER: female ENDOSCOPIST: Gatha Mayer, MD, [facility] PROCEDURE DATE:  12/04/2014 PROCEDURE:   Colonoscopy, diagnostic and Colonoscopy with snare polypectomy First Screening Colonoscopy - Avg.  risk and is 50 yrs.  old or older - No.  Prior Negative Screening - Now for repeat screening. N/A  History of Adenoma - Now for follow-up colonoscopy & has been > or = to 3 yrs.  N/A  Polyps removed today? Yes ASA CLASS:   Class II INDICATIONS:Unexplained iron deficiency anemia and Patient is not applicable for Colorectal Neoplasm Risk Assessment for this procedure. MEDICATIONS: Propofol 120 mg IV, Monitored anesthesia care, and Lidocaine 40 mg IV  DESCRIPTION OF PROCEDURE:   After the risks benefits and alternatives of the procedure were thoroughly explained, informed consent was obtained.  The digital rectal exam revealed no abnormalities of the rectum.   The LB [id number]  endoscope was introduced through the anus and advanced to the cecum, which was identified by both the appendix and ileocecal valve. No adverse events experienced.   The quality of the prep was excellent. (MiraLax was used)  The instrument was then slowly withdrawn as the colon was fully examined. Estimated blood loss is zero unless otherwise noted in this procedure report.      COLON FINDINGS: A sessile polyp measuring 6 mm in size was found in the sigmoid colon.  A polypectomy was performed with a cold snare. The resection was complete, the polyp tissue was completely retrieved and sent to histology.   There was diverticulosis noted throughout the entire examined colon.   The examination was otherwise normal.  Retroflexed views revealed no abnormalities. The time to cecum = 6.1 Withdrawal time = 10.1   The scope was withdrawn and  the procedure completed. COMPLICATIONS: There were no immediate complications.  ENDOSCOPIC IMPRESSION: 1.   Sessile polyp was found in the sigmoid colon; polypectomy was performed with a cold snare 2.   Diverticulosis was noted throughout the entire examined colon 3.   The examination was otherwise normal  RECOMMENDATIONS: Upper endoscopy next  eSigned:  Gatha Mayer, MD, [facility] 12/04/2014 4:04 PM   cc: Crist Infante, MD and The Patient

## 2014-12-05 ENCOUNTER — Telehealth: Payer: Self-pay | Admitting: Emergency Medicine

## 2014-12-05 NOTE — Telephone Encounter (Signed)
  Follow up Call-  Call back number 12/04/2014  Post procedure Call Back phone  # [phone]  Permission to leave phone message Yes     Patient questions:  Do you have a fever, pain , or abdominal swelling? No. Pain Score  0 *  Have you tolerated food without any problems? Yes.    Have you been able to return to your normal activities? Yes.    Do you have any questions about your discharge instructions: Diet   No. Medications  No. Follow up visit  No.  Do you have questions or concerns about your Care? No.  Actions: * If pain score is 4 or above: No action needed, pain <4.

## 2014-12-10 ENCOUNTER — Encounter: Payer: Self-pay | Admitting: Internal Medicine

## 2014-12-10 DIAGNOSIS — Z860101 Personal history of adenomatous and serrated colon polyps: Secondary | ICD-10-CM | POA: Insufficient documentation

## 2014-12-10 DIAGNOSIS — Z86010 Personal history of colonic polyps: Secondary | ICD-10-CM

## 2014-12-10 HISTORY — DX: Personal history of colonic polyps: Z86.010

## 2014-12-17 DIAGNOSIS — D509 Iron deficiency anemia, unspecified: Secondary | ICD-10-CM | POA: Diagnosis not present

## 2015-02-22 DIAGNOSIS — Z23 Encounter for immunization: Secondary | ICD-10-CM | POA: Diagnosis not present

## 2015-05-22 DIAGNOSIS — D508 Other iron deficiency anemias: Secondary | ICD-10-CM | POA: Diagnosis not present

## 2015-05-22 DIAGNOSIS — D509 Iron deficiency anemia, unspecified: Secondary | ICD-10-CM | POA: Diagnosis not present

## 2015-07-14 DIAGNOSIS — Z1231 Encounter for screening mammogram for malignant neoplasm of breast: Secondary | ICD-10-CM | POA: Diagnosis not present

## 2015-07-14 DIAGNOSIS — Z803 Family history of malignant neoplasm of breast: Secondary | ICD-10-CM | POA: Diagnosis not present

## 2015-10-14 DIAGNOSIS — R7301 Impaired fasting glucose: Secondary | ICD-10-CM | POA: Diagnosis not present

## 2015-10-14 DIAGNOSIS — D508 Other iron deficiency anemias: Secondary | ICD-10-CM | POA: Diagnosis not present

## 2015-10-14 DIAGNOSIS — R8299 Other abnormal findings in urine: Secondary | ICD-10-CM | POA: Diagnosis not present

## 2015-10-14 DIAGNOSIS — M859 Disorder of bone density and structure, unspecified: Secondary | ICD-10-CM | POA: Diagnosis not present

## 2015-10-14 DIAGNOSIS — N390 Urinary tract infection, site not specified: Secondary | ICD-10-CM | POA: Diagnosis not present

## 2015-10-14 DIAGNOSIS — E041 Nontoxic single thyroid nodule: Secondary | ICD-10-CM | POA: Diagnosis not present

## 2015-10-14 DIAGNOSIS — E784 Other hyperlipidemia: Secondary | ICD-10-CM | POA: Diagnosis not present

## 2015-10-21 DIAGNOSIS — E784 Other hyperlipidemia: Secondary | ICD-10-CM | POA: Diagnosis not present

## 2015-10-21 DIAGNOSIS — D508 Other iron deficiency anemias: Secondary | ICD-10-CM | POA: Diagnosis not present

## 2015-10-21 DIAGNOSIS — Z1231 Encounter for screening mammogram for malignant neoplasm of breast: Secondary | ICD-10-CM | POA: Diagnosis not present

## 2015-10-21 DIAGNOSIS — R918 Other nonspecific abnormal finding of lung field: Secondary | ICD-10-CM | POA: Diagnosis not present

## 2015-10-21 DIAGNOSIS — R3129 Other microscopic hematuria: Secondary | ICD-10-CM | POA: Diagnosis not present

## 2015-10-21 DIAGNOSIS — H9313 Tinnitus, bilateral: Secondary | ICD-10-CM | POA: Diagnosis not present

## 2015-10-21 DIAGNOSIS — I10 Essential (primary) hypertension: Secondary | ICD-10-CM | POA: Diagnosis not present

## 2015-10-21 DIAGNOSIS — M47896 Other spondylosis, lumbar region: Secondary | ICD-10-CM | POA: Diagnosis not present

## 2015-10-21 DIAGNOSIS — Z1389 Encounter for screening for other disorder: Secondary | ICD-10-CM | POA: Diagnosis not present

## 2015-10-21 DIAGNOSIS — Z0000 Encounter for general adult medical examination without abnormal findings: Secondary | ICD-10-CM | POA: Diagnosis not present

## 2015-10-21 DIAGNOSIS — Z6821 Body mass index (BMI) 21.0-21.9, adult: Secondary | ICD-10-CM | POA: Diagnosis not present

## 2015-10-21 DIAGNOSIS — E041 Nontoxic single thyroid nodule: Secondary | ICD-10-CM | POA: Diagnosis not present

## 2015-11-11 DIAGNOSIS — H353131 Nonexudative age-related macular degeneration, bilateral, early dry stage: Secondary | ICD-10-CM | POA: Diagnosis not present

## 2015-11-11 DIAGNOSIS — H26493 Other secondary cataract, bilateral: Secondary | ICD-10-CM | POA: Diagnosis not present

## 2015-11-11 DIAGNOSIS — H10413 Chronic giant papillary conjunctivitis, bilateral: Secondary | ICD-10-CM | POA: Diagnosis not present

## 2016-02-21 DIAGNOSIS — Z23 Encounter for immunization: Secondary | ICD-10-CM | POA: Diagnosis not present

## 2016-06-25 ENCOUNTER — Other Ambulatory Visit: Payer: Self-pay | Admitting: Internal Medicine

## 2016-06-25 DIAGNOSIS — Z1231 Encounter for screening mammogram for malignant neoplasm of breast: Secondary | ICD-10-CM

## 2016-07-23 ENCOUNTER — Ambulatory Visit
Admission: RE | Admit: 2016-07-23 | Discharge: 2016-07-23 | Disposition: A | Discharge status: Home / Self Care | Payer: Self-pay | Source: Ambulatory Visit | Attending: Internal Medicine | Admitting: Internal Medicine

## 2016-07-23 DIAGNOSIS — Z1231 Encounter for screening mammogram for malignant neoplasm of breast: Secondary | ICD-10-CM | POA: Diagnosis not present

## 2016-10-07 DIAGNOSIS — M859 Disorder of bone density and structure, unspecified: Secondary | ICD-10-CM | POA: Diagnosis not present

## 2016-11-19 DIAGNOSIS — R7301 Impaired fasting glucose: Secondary | ICD-10-CM | POA: Diagnosis not present

## 2016-11-19 DIAGNOSIS — M859 Disorder of bone density and structure, unspecified: Secondary | ICD-10-CM | POA: Diagnosis not present

## 2016-11-19 DIAGNOSIS — R358 Other polyuria: Secondary | ICD-10-CM | POA: Diagnosis not present

## 2016-11-19 DIAGNOSIS — E784 Other hyperlipidemia: Secondary | ICD-10-CM | POA: Diagnosis not present

## 2016-11-19 DIAGNOSIS — I10 Essential (primary) hypertension: Secondary | ICD-10-CM | POA: Diagnosis not present

## 2016-11-19 DIAGNOSIS — E041 Nontoxic single thyroid nodule: Secondary | ICD-10-CM | POA: Diagnosis not present

## 2016-11-19 DIAGNOSIS — R8299 Other abnormal findings in urine: Secondary | ICD-10-CM | POA: Diagnosis not present

## 2016-11-23 DIAGNOSIS — H35033 Hypertensive retinopathy, bilateral: Secondary | ICD-10-CM | POA: Diagnosis not present

## 2016-11-23 DIAGNOSIS — H10413 Chronic giant papillary conjunctivitis, bilateral: Secondary | ICD-10-CM | POA: Diagnosis not present

## 2016-11-23 DIAGNOSIS — Z961 Presence of intraocular lens: Secondary | ICD-10-CM | POA: Diagnosis not present

## 2016-11-23 DIAGNOSIS — H353131 Nonexudative age-related macular degeneration, bilateral, early dry stage: Secondary | ICD-10-CM | POA: Diagnosis not present

## 2016-11-23 DIAGNOSIS — H26493 Other secondary cataract, bilateral: Secondary | ICD-10-CM | POA: Diagnosis not present

## 2016-11-26 DIAGNOSIS — E041 Nontoxic single thyroid nodule: Secondary | ICD-10-CM | POA: Diagnosis not present

## 2016-11-26 DIAGNOSIS — Z1389 Encounter for screening for other disorder: Secondary | ICD-10-CM | POA: Diagnosis not present

## 2016-11-26 DIAGNOSIS — R3129 Other microscopic hematuria: Secondary | ICD-10-CM | POA: Diagnosis not present

## 2016-11-26 DIAGNOSIS — R7301 Impaired fasting glucose: Secondary | ICD-10-CM | POA: Diagnosis not present

## 2016-11-26 DIAGNOSIS — H9313 Tinnitus, bilateral: Secondary | ICD-10-CM | POA: Diagnosis not present

## 2016-11-26 DIAGNOSIS — N390 Urinary tract infection, site not specified: Secondary | ICD-10-CM | POA: Diagnosis not present

## 2016-11-26 DIAGNOSIS — R079 Chest pain, unspecified: Secondary | ICD-10-CM | POA: Diagnosis not present

## 2016-11-26 DIAGNOSIS — E784 Other hyperlipidemia: Secondary | ICD-10-CM | POA: Diagnosis not present

## 2016-11-26 DIAGNOSIS — M47896 Other spondylosis, lumbar region: Secondary | ICD-10-CM | POA: Diagnosis not present

## 2016-11-26 DIAGNOSIS — Z0000 Encounter for general adult medical examination without abnormal findings: Secondary | ICD-10-CM | POA: Diagnosis not present

## 2016-11-26 DIAGNOSIS — I10 Essential (primary) hypertension: Secondary | ICD-10-CM | POA: Diagnosis not present

## 2016-11-26 DIAGNOSIS — Z1231 Encounter for screening mammogram for malignant neoplasm of breast: Secondary | ICD-10-CM | POA: Diagnosis not present

## 2016-11-26 DIAGNOSIS — Z6822 Body mass index (BMI) 22.0-22.9, adult: Secondary | ICD-10-CM | POA: Diagnosis not present

## 2016-12-09 ENCOUNTER — Encounter (HOSPITAL_COMMUNITY): Payer: Self-pay

## 2016-12-09 ENCOUNTER — Ambulatory Visit (HOSPITAL_COMMUNITY)
Admission: RE | Admit: 2016-12-09 | Discharge: 2016-12-09 | Disposition: A | Discharge status: Home / Self Care | Payer: PPO | Source: Ambulatory Visit | Attending: Internal Medicine | Admitting: Internal Medicine

## 2016-12-09 DIAGNOSIS — N810 Urethrocele: Secondary | ICD-10-CM | POA: Diagnosis not present

## 2016-12-09 DIAGNOSIS — M810 Age-related osteoporosis without current pathological fracture: Secondary | ICD-10-CM | POA: Diagnosis present

## 2016-12-09 MED ORDER — SODIUM CHLORIDE 0.9 % IV SOLN
Freq: Once | INTRAVENOUS | Status: AC
  Administered 2016-12-09: 12:00:00 via INTRAVENOUS

## 2016-12-09 MED ORDER — ZOLEDRONIC ACID 5 MG/100ML IV SOLN
5.0000 mg | Freq: Once | INTRAVENOUS | Status: AC
  Administered 2016-12-09: 5 mg via INTRAVENOUS
  Filled 2016-12-09: qty 100

## 2016-12-09 NOTE — Discharge Instructions (Signed)
Drink  Fluids/water as tolerated over the next 72 hours Tylenol or ibuprofen if needed for aches and pains  Continue Calcium and Vit D as directed by your MD  Any emergencies call 911. Any problems or questions call Dr. Silvestre Mesi office.    Reclast Zoledronic Acid injection (Paget's Disease, Osteoporosis) What is this medicine? ZOLEDRONIC ACID (ZOE le dron ik AS id) lowers the amount of calcium loss from bone. It is used to treat Paget's disease and osteoporosis in women. This medicine may be used for other purposes; ask your health care provider or pharmacist if you have questions. COMMON BRAND NAME(S): Reclast, Zometa What should I tell my health care provider before I take this medicine? They need to know if you have any of these conditions: -aspirin-sensitive asthma -cancer, especially if you are receiving medicines used to treat cancer -dental disease or wear dentures -infection -kidney disease -low levels of calcium in the blood -past surgery on the parathyroid gland or intestines -receiving corticosteroids like dexamethasone or prednisone -an unusual or allergic reaction to zoledronic acid, other medicines, foods, dyes, or preservatives -pregnant or trying to get pregnant -breast-feeding How should I use this medicine? This medicine is for infusion into a vein. It is given by a health care professional in a hospital or clinic setting. Talk to your pediatrician regarding the use of this medicine in children. This medicine is not approved for use in children. Overdosage: If you think you have taken too much of this medicine contact a poison control center or emergency room at once. NOTE: This medicine is only for you. Do not share this medicine with others. What if I miss a dose? It is important not to miss your dose. Call your doctor or health care professional if you are unable to keep an appointment. What may interact with this medicine? -certain antibiotics given by  injection -NSAIDs, medicines for pain and inflammation, like ibuprofen or naproxen -some diuretics like bumetanide, furosemide -teriparatide This list may not describe all possible interactions. Give your health care provider a list of all the medicines, herbs, non-prescription drugs, or dietary supplements you use. Also tell them if you smoke, drink alcohol, or use illegal drugs. Some items may interact with your medicine. What should I watch for while using this medicine? Visit your doctor or health care professional for regular checkups. It may be some time before you see the benefit from this medicine. Do not stop taking your medicine unless your doctor tells you to. Your doctor may order blood tests or other tests to see how you are doing. Women should inform their doctor if they wish to become pregnant or think they might be pregnant. There is a potential for serious side effects to an unborn child. Talk to your health care professional or pharmacist for more information. You should make sure that you get enough calcium and vitamin D while you are taking this medicine. Discuss the foods you eat and the vitamins you take with your health care professional. Some people who take this medicine have severe bone, joint, and/or muscle pain. This medicine may also increase your risk for jaw problems or a broken thigh bone. Tell your doctor right away if you have severe pain in your jaw, bones, joints, or muscles. Tell your doctor if you have any pain that does not go away or that gets worse. Tell your dentist and dental surgeon that you are taking this medicine. You should not have major dental surgery while on this medicine.  See your dentist to have a dental exam and fix any dental problems before starting this medicine. Take good care of your teeth while on this medicine. Make sure you see your dentist for regular follow-up appointments. What side effects may I notice from receiving this medicine? Side  effects that you should report to your doctor or health care professional as soon as possible: -allergic reactions like skin rash, itching or hives, swelling of the face, lips, or tongue -anxiety, confusion, or depression -breathing problems -changes in vision -eye pain -feeling faint or lightheaded, falls -jaw pain, especially after dental work -mouth sores -muscle cramps, stiffness, or weakness -redness, blistering, peeling or loosening of the skin, including inside the mouth -trouble passing urine or change in the amount of urine Side effects that usually do not require medical attention (report to your doctor or health care professional if they continue or are bothersome): -bone, joint, or muscle pain -constipation -diarrhea -fever -hair loss -irritation at site where injected -loss of appetite -nausea, vomiting -stomach upset -trouble sleeping -trouble swallowing -weak or tired This list may not describe all possible side effects. Call your doctor for medical advice about side effects. You may report side effects to FDA at 1-800-FDA-1088. Where should I keep my medicine? This drug is given in a hospital or clinic and will not be stored at home. NOTE: This sheet is a summary. It may not cover all possible information. If you have questions about this medicine, talk to your doctor, pharmacist, or health care provider.  2018 Elsevier/Gold Standard (2013-10-20 14:19:57)

## 2017-02-09 DIAGNOSIS — R3129 Other microscopic hematuria: Secondary | ICD-10-CM | POA: Diagnosis not present

## 2017-02-19 DIAGNOSIS — Z23 Encounter for immunization: Secondary | ICD-10-CM | POA: Diagnosis not present

## 2017-08-01 ENCOUNTER — Other Ambulatory Visit: Payer: Self-pay | Admitting: Internal Medicine

## 2017-08-01 DIAGNOSIS — Z1231 Encounter for screening mammogram for malignant neoplasm of breast: Secondary | ICD-10-CM

## 2017-08-10 ENCOUNTER — Ambulatory Visit
Admission: RE | Admit: 2017-08-10 | Discharge: 2017-08-10 | Disposition: A | Discharge status: Home / Self Care | Payer: PPO | Source: Ambulatory Visit | Attending: Internal Medicine | Admitting: Internal Medicine

## 2017-08-10 DIAGNOSIS — Z1231 Encounter for screening mammogram for malignant neoplasm of breast: Secondary | ICD-10-CM

## 2017-11-23 DIAGNOSIS — H524 Presbyopia: Secondary | ICD-10-CM | POA: Diagnosis not present

## 2017-11-23 DIAGNOSIS — H35033 Hypertensive retinopathy, bilateral: Secondary | ICD-10-CM | POA: Diagnosis not present

## 2017-11-23 DIAGNOSIS — Z961 Presence of intraocular lens: Secondary | ICD-10-CM | POA: Diagnosis not present

## 2017-11-23 DIAGNOSIS — H10413 Chronic giant papillary conjunctivitis, bilateral: Secondary | ICD-10-CM | POA: Diagnosis not present

## 2017-11-23 DIAGNOSIS — H353131 Nonexudative age-related macular degeneration, bilateral, early dry stage: Secondary | ICD-10-CM | POA: Diagnosis not present

## 2017-11-23 DIAGNOSIS — H5202 Hypermetropia, left eye: Secondary | ICD-10-CM | POA: Diagnosis not present

## 2018-01-23 DIAGNOSIS — M859 Disorder of bone density and structure, unspecified: Secondary | ICD-10-CM | POA: Diagnosis not present

## 2018-01-23 DIAGNOSIS — I10 Essential (primary) hypertension: Secondary | ICD-10-CM | POA: Diagnosis not present

## 2018-01-23 DIAGNOSIS — R7301 Impaired fasting glucose: Secondary | ICD-10-CM | POA: Diagnosis not present

## 2018-01-23 DIAGNOSIS — E041 Nontoxic single thyroid nodule: Secondary | ICD-10-CM | POA: Diagnosis not present

## 2018-01-23 DIAGNOSIS — E7849 Other hyperlipidemia: Secondary | ICD-10-CM | POA: Diagnosis not present

## 2018-01-23 DIAGNOSIS — R82998 Other abnormal findings in urine: Secondary | ICD-10-CM | POA: Diagnosis not present

## 2018-01-30 DIAGNOSIS — M47896 Other spondylosis, lumbar region: Secondary | ICD-10-CM | POA: Diagnosis not present

## 2018-01-30 DIAGNOSIS — R918 Other nonspecific abnormal finding of lung field: Secondary | ICD-10-CM | POA: Diagnosis not present

## 2018-01-30 DIAGNOSIS — Z23 Encounter for immunization: Secondary | ICD-10-CM | POA: Diagnosis not present

## 2018-01-30 DIAGNOSIS — E041 Nontoxic single thyroid nodule: Secondary | ICD-10-CM | POA: Diagnosis not present

## 2018-01-30 DIAGNOSIS — I10 Essential (primary) hypertension: Secondary | ICD-10-CM | POA: Diagnosis not present

## 2018-01-30 DIAGNOSIS — D508 Other iron deficiency anemias: Secondary | ICD-10-CM | POA: Diagnosis not present

## 2018-01-30 DIAGNOSIS — Z6821 Body mass index (BMI) 21.0-21.9, adult: Secondary | ICD-10-CM | POA: Diagnosis not present

## 2018-01-30 DIAGNOSIS — R7301 Impaired fasting glucose: Secondary | ICD-10-CM | POA: Diagnosis not present

## 2018-01-30 DIAGNOSIS — Z1389 Encounter for screening for other disorder: Secondary | ICD-10-CM | POA: Diagnosis not present

## 2018-01-30 DIAGNOSIS — Z0000 Encounter for general adult medical examination without abnormal findings: Secondary | ICD-10-CM | POA: Diagnosis not present

## 2018-01-30 DIAGNOSIS — M859 Disorder of bone density and structure, unspecified: Secondary | ICD-10-CM | POA: Diagnosis not present

## 2018-01-30 DIAGNOSIS — E7849 Other hyperlipidemia: Secondary | ICD-10-CM | POA: Diagnosis not present

## 2018-01-30 DIAGNOSIS — H9313 Tinnitus, bilateral: Secondary | ICD-10-CM | POA: Diagnosis not present

## 2018-05-08 ENCOUNTER — Ambulatory Visit (INDEPENDENT_AMBULATORY_CARE_PROVIDER_SITE_OTHER): Payer: PPO | Admitting: Orthopedic Surgery

## 2018-05-08 ENCOUNTER — Encounter (INDEPENDENT_AMBULATORY_CARE_PROVIDER_SITE_OTHER): Payer: Self-pay | Admitting: Orthopedic Surgery

## 2018-05-08 ENCOUNTER — Ambulatory Visit (INDEPENDENT_AMBULATORY_CARE_PROVIDER_SITE_OTHER): Payer: PPO

## 2018-05-08 DIAGNOSIS — M25511 Pain in right shoulder: Secondary | ICD-10-CM

## 2018-05-08 DIAGNOSIS — M75101 Unspecified rotator cuff tear or rupture of right shoulder, not specified as traumatic: Secondary | ICD-10-CM | POA: Diagnosis not present

## 2018-05-08 MED ORDER — LIDOCAINE HCL 1 % IJ SOLN
5.0000 mL | INTRAMUSCULAR | Status: AC | PRN
  Administered 2018-05-08: 5 mL

## 2018-05-08 MED ORDER — BUPIVACAINE HCL 0.5 % IJ SOLN
9.0000 mL | INTRAMUSCULAR | Status: AC | PRN
  Administered 2018-05-08: 9 mL via INTRA_ARTICULAR

## 2018-05-08 MED ORDER — METHYLPREDNISOLONE ACETATE 40 MG/ML IJ SUSP
40.0000 mg | INTRAMUSCULAR | Status: AC | PRN
  Administered 2018-05-08: 40 mg via INTRA_ARTICULAR

## 2018-05-08 NOTE — Progress Notes (Signed)
Office Visit Note   Patient: Julia Bush           Date of Birth: 02/06/1930           MRN: [id number] Visit Date: 05/08/2018 Requested by: Crist Infante, MD [address] [place], [place] [address] PCP: Crist Infante, MD  Subjective: Chief Complaint  Patient presents with  . Right Shoulder - Pain    HPI: Julia Bush is a patient with right shoulder pain.  She is had pain for a month.  Is not getting any better.  Denies any discrete history of injury.  She has not seen a physician for this problem yet.  Denies any neck pain or radicular symptoms in that right arm.  He describes decreased abduction strength.  She does like to sleep on that right-hand side              ROS: All systems reviewed are negative as they relate to the chief complaint within the history of present illness.  Patient denies  fevers or chills.   Assessment & Plan: Visit Diagnoses:  1. Acute pain of right shoulder   2. Tear of right rotator cuff, unspecified tear extent, unspecified whether traumatic     Plan: Impression is right shoulder pain with likely some small rotator cuff tear.  She is not really interested in any type of extensive work-up or surgical procedure.  I think her best bet today would be for subacromial injection.  We will see how she does with that intervention and I will see her back as needed  Follow-Up Instructions: Return if symptoms worsen or fail to improve.   Orders:  Orders Placed This Encounter  Procedures  . XR Shoulder Right   No orders of the defined types were placed in this encounter.     Procedures: Large Joint Inj: R subacromial bursa on 05/08/2018 7:57 PM Indications: diagnostic evaluation and pain Details: 18 G 1.5 in needle, posterior approach  Arthrogram: No  Medications: 9 mL bupivacaine 0.5 %; 40 mg methylPREDNISolone acetate 40 MG/ML; 5 mL lidocaine 1 % Outcome: tolerated well, no immediate complications Procedure, treatment alternatives, risks and  benefits explained, specific risks discussed. Consent was given by the patient. Immediately prior to procedure a time out was called to verify the correct patient, procedure, equipment, support staff and site/side marked as required. Patient was prepped and draped in the usual sterile fashion.       Clinical Data: No additional findings.  Objective: Vital Signs: There were no vitals taken for this visit.  Physical Exam:   Constitutional: Patient appears well-developed HEENT:  Head: Normocephalic Eyes:EOM are normal Neck: Normal range of motion Cardiovascular: Normal rate Pulmonary/chest: Effort normal Neurologic: Patient is alert Skin: Skin is warm Psychiatric: Patient has normal mood and affect    Ortho Exam: Ortho exam demonstrates full active and passive range of motion of the cervical spine.  On the right-hand side she does have abduction to about 80 degrees compared to 100 on the left.  Rotator cuff strength however is pretty symmetric on both sides to infraspinatus supraspinatus and subscap muscle testing.  No AC joint tenderness on the right.  Motor or sensory function to the hand is intact.  Radial pulses intact on the right.  She does have a little bit of coarseness and grinding on the right with internal and external rotation above 90 degrees of abduction which she does not have on the left  Specialty Comments:  No specialty comments available.  Imaging: Xr Shoulder Right  Result Date: 05/08/2018 AP outlet axillary right shoulder reviewed.  Visualized lung fields clear.  Acromiohumeral distance maintained.  AC joint intact.  No glenohumeral joint arthritis is present.  Shoulder is located    PMFS History: Patient Active Problem List   Diagnosis Date Noted  . Hx of adenomatous polyp of colon 12/10/2014  . Lung nodule 10/25/2011  . Epigastric pain 08/03/2011  . GERD (gastroesophageal reflux disease) 08/03/2011   Past Medical History:  Diagnosis Date  . Benign  fundic gland polyps of stomach 2013  . Cataract   . Chronic low back pain   . Diverticulosis   . DJD (degenerative joint disease)   . Gallstones   . GERD (gastroesophageal reflux disease)   . Hemorrhoids, external   . Hiatal hernia   . Hx of adenomatous polyp of colon 12/10/2014  . Hyperlipidemia   . Hypertension   . Microhematuria   . Osteopenia   . Shingles 1980  . Urinary, incontinence, stress female     Family History  Problem Relation Age of Onset  . Breast cancer Mother   . Colon cancer Neg Hx   . Esophageal cancer Neg Hx   . Rectal cancer Neg Hx   . Stomach cancer Neg Hx     Past Surgical History:  Procedure Laterality Date  . ABDOMINAL HYSTERECTOMY    . BREAST EXCISIONAL BIOPSY Left   . BREAST EXCISIONAL BIOPSY Right   . COLONOSCOPY    . Cyst removed from breasts     age 64  . LAPAROSCOPIC CHOLECYSTECTOMY    . UPPER GASTROINTESTINAL ENDOSCOPY     fundic gland polyps   Social History   Occupational History  . Occupation: Retired   Tobacco Use  . Smoking status: Never Smoker  . Smokeless tobacco: Never Used  Substance and Sexual Activity  . Alcohol use: No    Alcohol/week: 0.0 standard drinks  . Drug use: No  . Sexual activity: Not on file

## 2018-06-20 IMAGING — MG 2D DIGITAL SCREENING BILATERAL MAMMOGRAM WITH CAD AND ADJUNCT TO
9 of 13 series · 9 of 29 positions shown · non-contrast
Comparison: Previous exam(s).

CLINICAL DATA: Screening.

EXAM:
2D DIGITAL SCREENING BILATERAL MAMMOGRAM WITH CAD AND ADJUNCT TOMO

[R CC (1 of 2)]
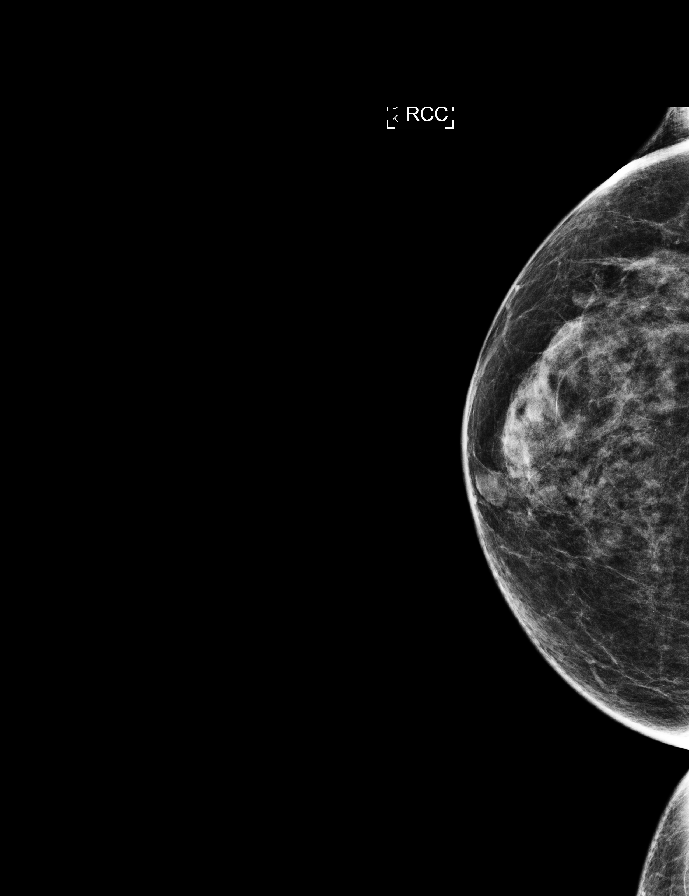

[L MLO]
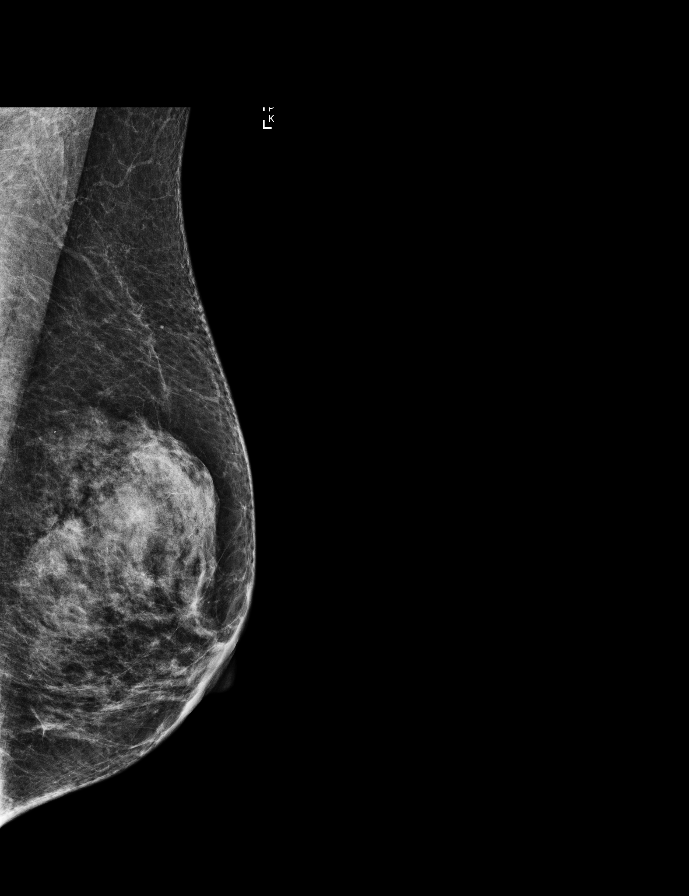

[R CC synth-2D]
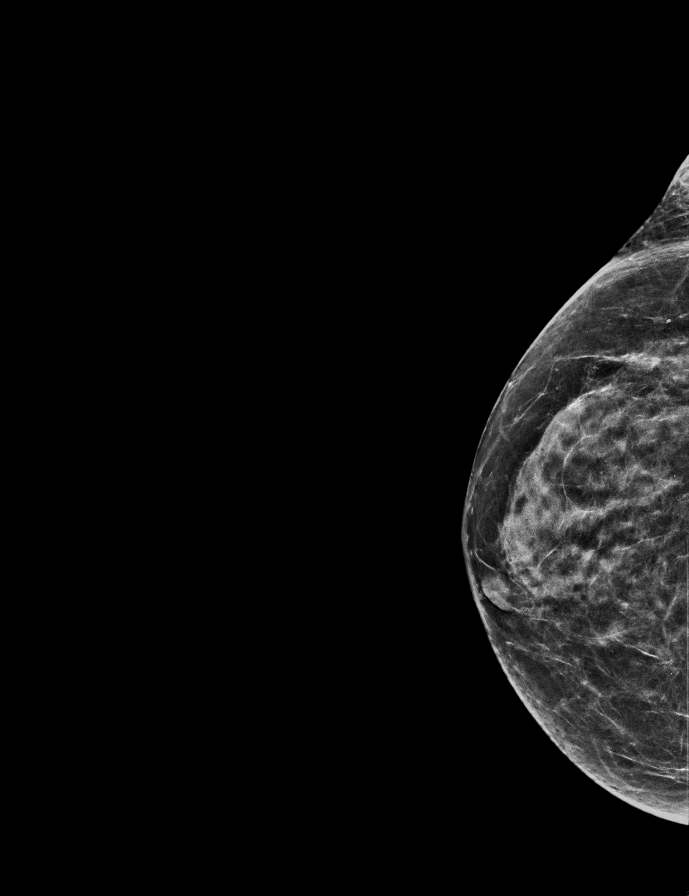

[L CC synth-2D]
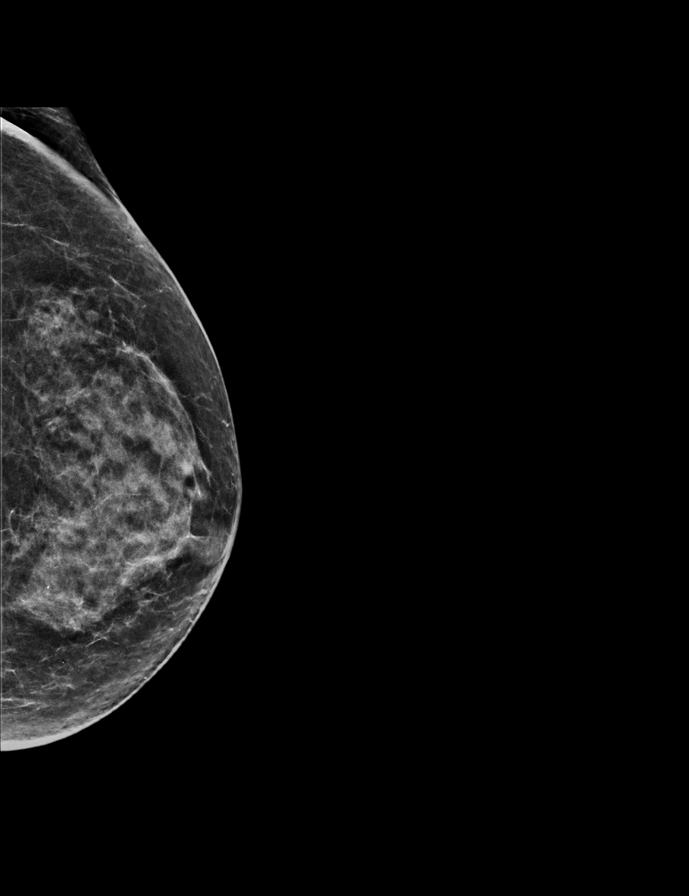

[R MLO synth-2D]
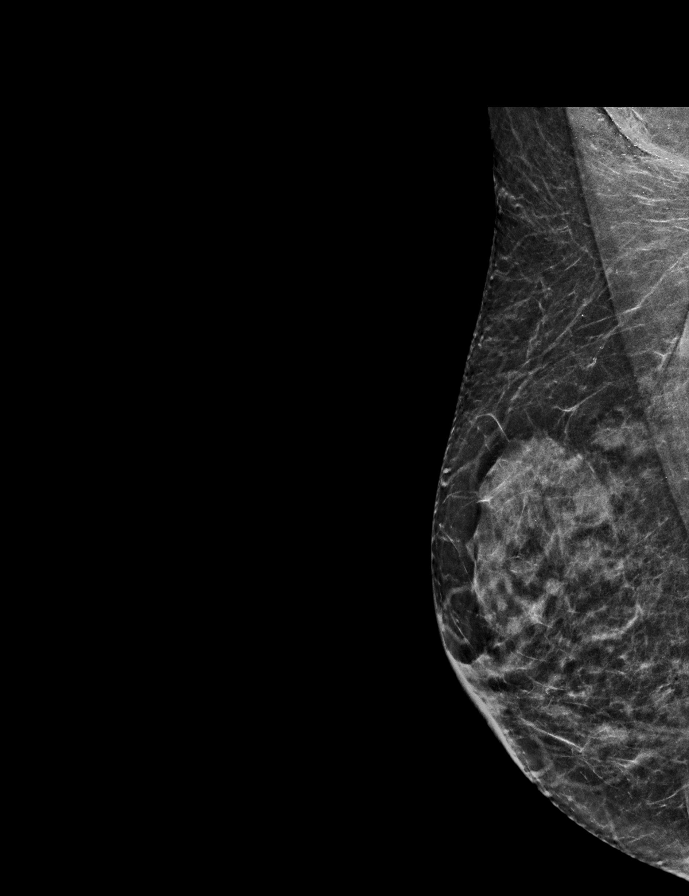

[R CC (2 of 2)]
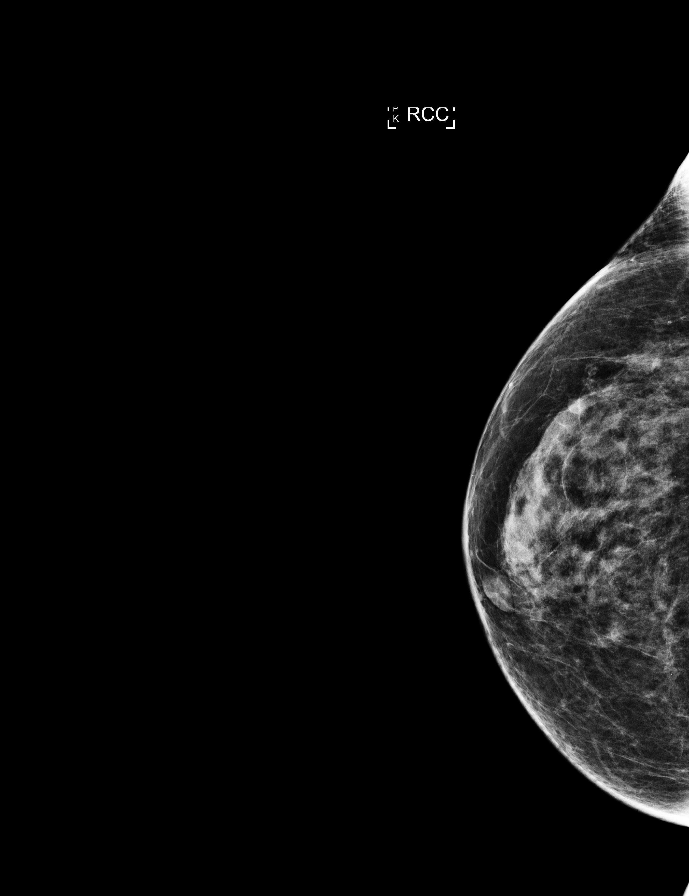

[L MLO synth-2D]
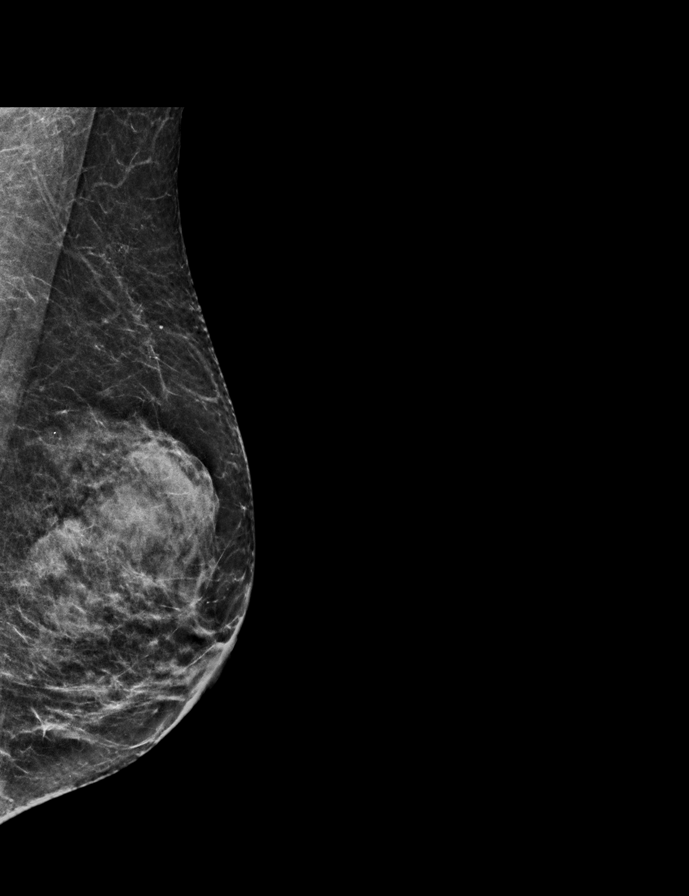

[L CC]
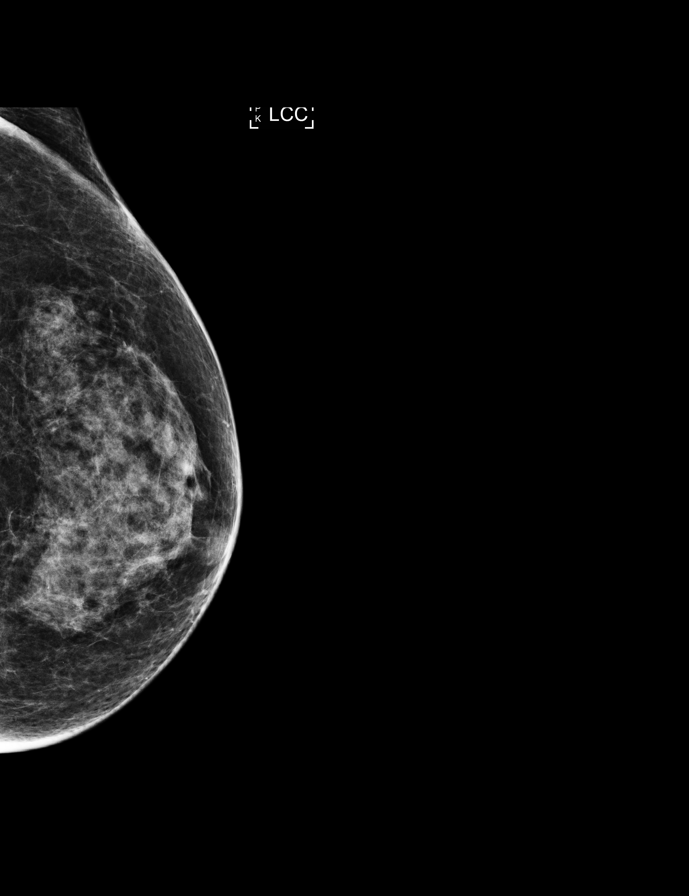

[R MLO]
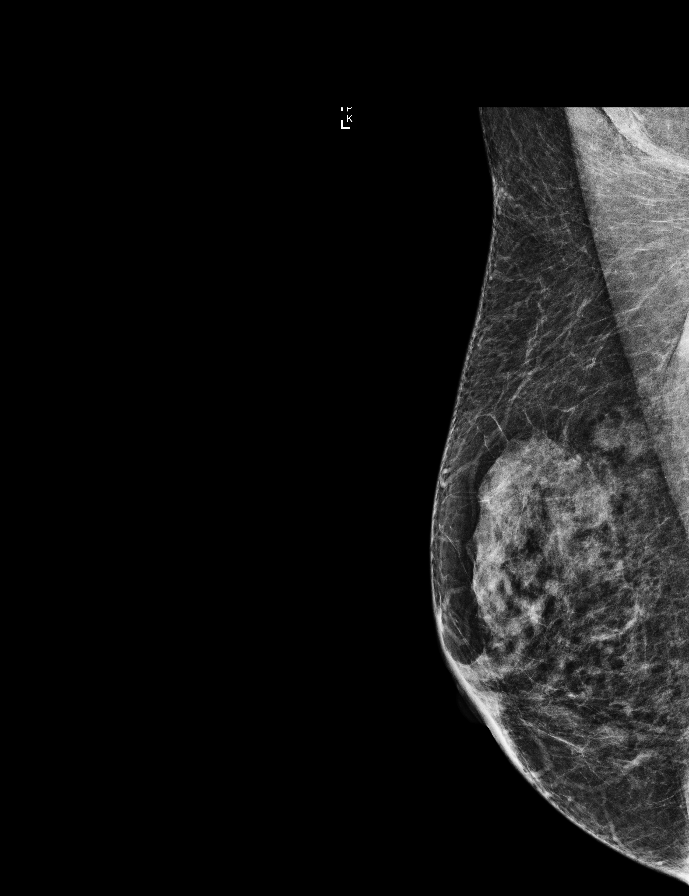

[9 of 29 positions shown; findings below may reference images not displayed]

ACR Breast Density Category c: The breast tissue is heterogeneously
dense, which may obscure small masses.
FINDINGS: There are no findings suspicious for malignancy. Images were
processed with CAD.
IMPRESSION: No mammographic evidence of malignancy. A result letter of this
screening mammogram will be mailed directly to the patient.

RECOMMENDATION:
Screening mammogram in one year. (Code:[id number])

BI-RADS CATEGORY  1: Negative.

## 2018-07-03 ENCOUNTER — Other Ambulatory Visit: Payer: Self-pay | Admitting: Internal Medicine

## 2018-07-03 DIAGNOSIS — Z1231 Encounter for screening mammogram for malignant neoplasm of breast: Secondary | ICD-10-CM

## 2018-07-10 DIAGNOSIS — H02824 Cysts of left upper eyelid: Secondary | ICD-10-CM | POA: Diagnosis not present

## 2018-07-10 DIAGNOSIS — H02825 Cysts of left lower eyelid: Secondary | ICD-10-CM | POA: Diagnosis not present

## 2018-08-07 DIAGNOSIS — H35033 Hypertensive retinopathy, bilateral: Secondary | ICD-10-CM | POA: Diagnosis not present

## 2018-08-07 DIAGNOSIS — H10413 Chronic giant papillary conjunctivitis, bilateral: Secondary | ICD-10-CM | POA: Diagnosis not present

## 2018-08-07 DIAGNOSIS — H353131 Nonexudative age-related macular degeneration, bilateral, early dry stage: Secondary | ICD-10-CM | POA: Diagnosis not present

## 2018-08-07 DIAGNOSIS — H5202 Hypermetropia, left eye: Secondary | ICD-10-CM | POA: Diagnosis not present

## 2018-08-07 DIAGNOSIS — H26492 Other secondary cataract, left eye: Secondary | ICD-10-CM | POA: Diagnosis not present

## 2018-08-15 ENCOUNTER — Ambulatory Visit
Admission: RE | Admit: 2018-08-15 | Discharge: 2018-08-15 | Disposition: A | Discharge status: Home / Self Care | Payer: PPO | Source: Ambulatory Visit | Attending: Internal Medicine | Admitting: Internal Medicine

## 2018-08-15 DIAGNOSIS — Z1231 Encounter for screening mammogram for malignant neoplasm of breast: Secondary | ICD-10-CM

## 2018-11-06 DIAGNOSIS — H26492 Other secondary cataract, left eye: Secondary | ICD-10-CM | POA: Diagnosis not present

## 2019-01-25 DIAGNOSIS — Z23 Encounter for immunization: Secondary | ICD-10-CM | POA: Diagnosis not present

## 2019-03-06 DIAGNOSIS — E7849 Other hyperlipidemia: Secondary | ICD-10-CM | POA: Diagnosis not present

## 2019-03-06 DIAGNOSIS — R7301 Impaired fasting glucose: Secondary | ICD-10-CM | POA: Diagnosis not present

## 2019-03-06 DIAGNOSIS — R82998 Other abnormal findings in urine: Secondary | ICD-10-CM | POA: Diagnosis not present

## 2019-03-06 DIAGNOSIS — M859 Disorder of bone density and structure, unspecified: Secondary | ICD-10-CM | POA: Diagnosis not present

## 2019-03-07 DIAGNOSIS — Z1212 Encounter for screening for malignant neoplasm of rectum: Secondary | ICD-10-CM | POA: Diagnosis not present

## 2019-03-13 DIAGNOSIS — R079 Chest pain, unspecified: Secondary | ICD-10-CM | POA: Diagnosis not present

## 2019-03-13 DIAGNOSIS — I10 Essential (primary) hypertension: Secondary | ICD-10-CM | POA: Diagnosis not present

## 2019-03-13 DIAGNOSIS — H9209 Otalgia, unspecified ear: Secondary | ICD-10-CM | POA: Diagnosis not present

## 2019-03-13 DIAGNOSIS — R918 Other nonspecific abnormal finding of lung field: Secondary | ICD-10-CM | POA: Diagnosis not present

## 2019-03-13 DIAGNOSIS — E785 Hyperlipidemia, unspecified: Secondary | ICD-10-CM | POA: Diagnosis not present

## 2019-03-13 DIAGNOSIS — Z0000 Encounter for general adult medical examination without abnormal findings: Secondary | ICD-10-CM | POA: Diagnosis not present

## 2019-03-13 DIAGNOSIS — E041 Nontoxic single thyroid nodule: Secondary | ICD-10-CM | POA: Diagnosis not present

## 2019-03-13 DIAGNOSIS — R3121 Asymptomatic microscopic hematuria: Secondary | ICD-10-CM | POA: Diagnosis not present

## 2019-03-13 DIAGNOSIS — Z1331 Encounter for screening for depression: Secondary | ICD-10-CM | POA: Diagnosis not present

## 2019-03-13 DIAGNOSIS — R7301 Impaired fasting glucose: Secondary | ICD-10-CM | POA: Diagnosis not present

## 2019-03-13 DIAGNOSIS — K219 Gastro-esophageal reflux disease without esophagitis: Secondary | ICD-10-CM | POA: Diagnosis not present

## 2019-03-13 DIAGNOSIS — M8580 Other specified disorders of bone density and structure, unspecified site: Secondary | ICD-10-CM | POA: Diagnosis not present

## 2019-06-20 DIAGNOSIS — H0014 Chalazion left upper eyelid: Secondary | ICD-10-CM | POA: Diagnosis not present

## 2019-06-20 DIAGNOSIS — H10413 Chronic giant papillary conjunctivitis, bilateral: Secondary | ICD-10-CM | POA: Diagnosis not present

## 2019-06-20 DIAGNOSIS — H35033 Hypertensive retinopathy, bilateral: Secondary | ICD-10-CM | POA: Diagnosis not present

## 2019-06-20 DIAGNOSIS — H353131 Nonexudative age-related macular degeneration, bilateral, early dry stage: Secondary | ICD-10-CM | POA: Diagnosis not present

## 2019-06-28 ENCOUNTER — Ambulatory Visit: Payer: PPO | Attending: Internal Medicine

## 2019-06-28 DIAGNOSIS — Z23 Encounter for immunization: Secondary | ICD-10-CM

## 2019-06-28 NOTE — Progress Notes (Signed)
   Covid-19 Vaccination Clinic  Name:  Julia Bush    MRN: [id number] DOB: 12-27-1929  06/28/2019  Julia Bush was observed post Covid-19 immunization for 15 minutes without incidence. She was provided with Vaccine Information Sheet and instruction to access the V-Safe system.   Julia Bush was instructed to call 911 with any severe reactions post vaccine: Marland Kitchen Difficulty breathing  . Swelling of your face and throat  . A fast heartbeat  . A bad rash all over your body  . Dizziness and weakness    Immunizations Administered    Name Date Dose VIS Date Route   Pfizer COVID-19 Vaccine 06/28/2019  1:00 PM 0.3 mL 05/18/2019 Intramuscular   Manufacturer: [place]   Lot: [id number]   [place]: [id number]

## 2019-07-05 ENCOUNTER — Other Ambulatory Visit: Payer: Self-pay | Admitting: Internal Medicine

## 2019-07-05 DIAGNOSIS — Z1231 Encounter for screening mammogram for malignant neoplasm of breast: Secondary | ICD-10-CM

## 2019-07-19 ENCOUNTER — Ambulatory Visit: Payer: PPO | Attending: Internal Medicine

## 2019-07-19 DIAGNOSIS — Z23 Encounter for immunization: Secondary | ICD-10-CM | POA: Insufficient documentation

## 2019-07-19 NOTE — Progress Notes (Signed)
   Covid-19 Vaccination Clinic  Name:  Julia Bush    MRN: [id number] DOB: 1929/07/06  07/19/2019  Julia Bush was observed post Covid-19 immunization for 15 minutes without incidence. She was provided with Vaccine Information Sheet and instruction to access the V-Safe system.   Julia Bush was instructed to call 911 with any severe reactions post vaccine: Marland Kitchen Difficulty breathing  . Swelling of your face and throat  . A fast heartbeat  . A bad rash all over your body  . Dizziness and weakness    Immunizations Administered    Name Date Dose VIS Date Route   Pfizer COVID-19 Vaccine 07/19/2019 12:59 PM 0.3 mL 05/18/2019 Intramuscular   Manufacturer: [place]   Lot: [id number]   [place]: [id number]

## 2019-08-21 ENCOUNTER — Ambulatory Visit: Payer: PPO

## 2019-09-18 ENCOUNTER — Ambulatory Visit: Payer: PPO

## 2019-10-17 ENCOUNTER — Ambulatory Visit
Admission: RE | Admit: 2019-10-17 | Discharge: 2019-10-17 | Disposition: A | Discharge status: Home / Self Care | Payer: PPO | Source: Ambulatory Visit | Attending: Internal Medicine | Admitting: Internal Medicine

## 2019-10-17 ENCOUNTER — Other Ambulatory Visit: Payer: Self-pay

## 2019-10-17 DIAGNOSIS — Z1231 Encounter for screening mammogram for malignant neoplasm of breast: Secondary | ICD-10-CM | POA: Diagnosis not present

## 2019-12-27 DIAGNOSIS — Z1152 Encounter for screening for COVID-19: Secondary | ICD-10-CM | POA: Diagnosis not present

## 2019-12-27 DIAGNOSIS — R05 Cough: Secondary | ICD-10-CM | POA: Diagnosis not present

## 2019-12-27 DIAGNOSIS — J069 Acute upper respiratory infection, unspecified: Secondary | ICD-10-CM | POA: Diagnosis not present

## 2020-03-08 DIAGNOSIS — Z23 Encounter for immunization: Secondary | ICD-10-CM | POA: Diagnosis not present

## 2020-03-24 DIAGNOSIS — E785 Hyperlipidemia, unspecified: Secondary | ICD-10-CM | POA: Diagnosis not present

## 2020-03-24 DIAGNOSIS — E041 Nontoxic single thyroid nodule: Secondary | ICD-10-CM | POA: Diagnosis not present

## 2020-03-24 DIAGNOSIS — M859 Disorder of bone density and structure, unspecified: Secondary | ICD-10-CM | POA: Diagnosis not present

## 2020-03-24 DIAGNOSIS — R7301 Impaired fasting glucose: Secondary | ICD-10-CM | POA: Diagnosis not present

## 2020-03-28 DIAGNOSIS — I10 Essential (primary) hypertension: Secondary | ICD-10-CM | POA: Diagnosis not present

## 2020-03-28 DIAGNOSIS — E785 Hyperlipidemia, unspecified: Secondary | ICD-10-CM | POA: Diagnosis not present

## 2020-03-28 DIAGNOSIS — Z0000 Encounter for general adult medical examination without abnormal findings: Secondary | ICD-10-CM | POA: Diagnosis not present

## 2020-03-28 DIAGNOSIS — M8580 Other specified disorders of bone density and structure, unspecified site: Secondary | ICD-10-CM | POA: Diagnosis not present

## 2020-03-28 DIAGNOSIS — R7301 Impaired fasting glucose: Secondary | ICD-10-CM | POA: Diagnosis not present

## 2020-03-28 DIAGNOSIS — R82998 Other abnormal findings in urine: Secondary | ICD-10-CM | POA: Diagnosis not present

## 2020-03-28 DIAGNOSIS — R3121 Asymptomatic microscopic hematuria: Secondary | ICD-10-CM | POA: Diagnosis not present

## 2020-03-28 DIAGNOSIS — M47896 Other spondylosis, lumbar region: Secondary | ICD-10-CM | POA: Diagnosis not present

## 2020-04-09 ENCOUNTER — Other Ambulatory Visit: Payer: Self-pay | Admitting: Internal Medicine

## 2020-04-09 DIAGNOSIS — M8580 Other specified disorders of bone density and structure, unspecified site: Secondary | ICD-10-CM

## 2020-04-24 DIAGNOSIS — N390 Urinary tract infection, site not specified: Secondary | ICD-10-CM | POA: Diagnosis not present

## 2020-04-24 DIAGNOSIS — I10 Essential (primary) hypertension: Secondary | ICD-10-CM | POA: Diagnosis not present

## 2020-07-16 DIAGNOSIS — H5202 Hypermetropia, left eye: Secondary | ICD-10-CM | POA: Diagnosis not present

## 2020-07-16 DIAGNOSIS — H353131 Nonexudative age-related macular degeneration, bilateral, early dry stage: Secondary | ICD-10-CM | POA: Diagnosis not present

## 2020-07-16 DIAGNOSIS — H524 Presbyopia: Secondary | ICD-10-CM | POA: Diagnosis not present

## 2020-07-16 DIAGNOSIS — H0014 Chalazion left upper eyelid: Secondary | ICD-10-CM | POA: Diagnosis not present

## 2020-07-16 DIAGNOSIS — Z961 Presence of intraocular lens: Secondary | ICD-10-CM | POA: Diagnosis not present

## 2020-07-16 DIAGNOSIS — H10413 Chronic giant papillary conjunctivitis, bilateral: Secondary | ICD-10-CM | POA: Diagnosis not present

## 2020-07-22 ENCOUNTER — Other Ambulatory Visit: Payer: Self-pay

## 2020-07-22 ENCOUNTER — Ambulatory Visit
Admission: RE | Admit: 2020-07-22 | Discharge: 2020-07-22 | Disposition: A | Discharge status: Home / Self Care | Payer: PPO | Source: Ambulatory Visit | Attending: Internal Medicine | Admitting: Internal Medicine

## 2020-07-22 DIAGNOSIS — M8580 Other specified disorders of bone density and structure, unspecified site: Secondary | ICD-10-CM

## 2020-07-22 DIAGNOSIS — M8589 Other specified disorders of bone density and structure, multiple sites: Secondary | ICD-10-CM | POA: Diagnosis not present

## 2020-07-22 DIAGNOSIS — Z780 Asymptomatic menopausal state: Secondary | ICD-10-CM | POA: Diagnosis not present

## 2020-09-30 ENCOUNTER — Other Ambulatory Visit: Payer: Self-pay | Admitting: Internal Medicine

## 2020-09-30 DIAGNOSIS — Z1231 Encounter for screening mammogram for malignant neoplasm of breast: Secondary | ICD-10-CM

## 2020-11-24 ENCOUNTER — Ambulatory Visit
Admission: RE | Admit: 2020-11-24 | Discharge: 2020-11-24 | Disposition: A | Discharge status: Home / Self Care | Payer: PPO | Source: Ambulatory Visit | Attending: Internal Medicine | Admitting: Internal Medicine

## 2020-11-24 ENCOUNTER — Other Ambulatory Visit: Payer: Self-pay

## 2020-11-24 DIAGNOSIS — Z1231 Encounter for screening mammogram for malignant neoplasm of breast: Secondary | ICD-10-CM

## 2020-12-25 ENCOUNTER — Other Ambulatory Visit: Payer: Self-pay

## 2020-12-25 ENCOUNTER — Ambulatory Visit: Payer: PPO | Admitting: Orthopedic Surgery

## 2020-12-25 ENCOUNTER — Ambulatory Visit: Payer: Self-pay

## 2020-12-25 ENCOUNTER — Ambulatory Visit (INDEPENDENT_AMBULATORY_CARE_PROVIDER_SITE_OTHER): Payer: PPO

## 2020-12-25 DIAGNOSIS — M7541 Impingement syndrome of right shoulder: Secondary | ICD-10-CM

## 2020-12-25 DIAGNOSIS — G8929 Other chronic pain: Secondary | ICD-10-CM

## 2020-12-25 DIAGNOSIS — M25511 Pain in right shoulder: Secondary | ICD-10-CM

## 2020-12-25 DIAGNOSIS — M25512 Pain in left shoulder: Secondary | ICD-10-CM

## 2020-12-26 ENCOUNTER — Encounter: Payer: Self-pay | Admitting: Orthopedic Surgery

## 2020-12-26 NOTE — Progress Notes (Signed)
Office Visit Note   Patient: Julia Bush           Date of Birth: 01-27-1930           MRN: [id number] Visit Date: 12/25/2020 Requested by: Crist Infante, MD [address] [place],  [place] [address] PCP: Crist Infante, MD  Subjective: Chief Complaint  Patient presents with   Right Shoulder - Pain   Left Shoulder - Pain    HPI: Julia Bush is a 87 y.o. female who presents to the office complaining of bilateral shoulder pain.  Patient notes both shoulders have been causing her pain for several months without any injury.  She is right-handed.  She has diffuse pain through both shoulders with no focal area of pain.  No history of prior surgery to the shoulders or injury.  She denies any neck pain but does note occasional numbness in both arms.  Denies any tingling down the arm or any scapular pain.  She wakes with pain at night from right shoulder pain but not the left.  She notes both shoulders bother her with holding her steering wheel at the top of the wheel.  She has had previous shoulder injection several years ago that "lasted a long time".  She does not take any medication for pain..                ROS: All systems reviewed are negative as they relate to the chief complaint within the history of present illness.  Patient denies fevers or chills.  Assessment & Plan: Visit Diagnoses:  1. Impingement syndrome of right shoulder   2. Chronic pain of both shoulders     Plan: Patient is an 87 year old female who presents complaining of right shoulder pain primarily today.  She does have history of both shoulders causing her pretty equivalent pain over the last several months but the right shoulder is particularly worse today.  She has no weakness on exam and radiographs are negative for any acute changes.  She does have positive impingement signs but excellent rotator cuff strength.  Plan to administer right shoulder subacromial injection.  Patient tolerated the procedure  well.  Plan to follow-up in 10 days for clinical recheck and possible administration of left shoulder cortisone injection at that time.  Follow-Up Instructions: No follow-ups on file.   Orders:  Orders Placed This Encounter  Procedures   XR Shoulder Left   XR Shoulder Right   No orders of the defined types were placed in this encounter.     Procedures: Large Joint Inj: R subacromial bursa on 12/25/2020 8:02 PM Indications: diagnostic evaluation and pain Details: 18 G 1.5 in needle, posterior approach  Arthrogram: No  Medications: 9 mL bupivacaine 0.5 %; 40 mg methylPREDNISolone acetate 40 MG/ML; 5 mL lidocaine 1 % Outcome: tolerated well, no immediate complications Procedure, treatment alternatives, risks and benefits explained, specific risks discussed. Consent was given by the patient. Immediately prior to procedure a time out was called to verify the correct patient, procedure, equipment, support staff and site/side marked as required. Patient was prepped and draped in the usual sterile fashion.      Clinical Data: No additional findings.  Objective: Vital Signs: There were no vitals taken for this visit.  Physical Exam:  Constitutional: Patient appears well-developed HEENT:  Head: Normocephalic Eyes:EOM are normal Neck: Normal range of motion Cardiovascular: Normal rate Pulmonary/chest: Effort normal Neurologic: Patient is alert Skin: Skin is warm Psychiatric: Patient has normal mood and affect  Ortho Exam: Ortho exam demonstrates right shoulder with 70 degrees external rotation, 80 degrees abduction, 175 degrees forward flexion.  This compared with the left shoulder with 70 degrees external rotation, 80 degrees abduction, 1 7 degrees forward flexion.  Excellent rotator cuff strength of supra, infra, subscap bilaterally.  No crepitus noted with passive motion of the shoulders.  +5 motor strength of bilateral grip strength, finger abduction, pronation/supination,  bicep, tricep, deltoid.  No pain with cervical spine range of motion.  Positive Neer and Hawkins impingement signs.  Specialty Comments:  No specialty comments available.  Imaging: No results found.   PMFS History: Patient Active Problem List   Diagnosis Date Noted   Hx of adenomatous polyp of colon 12/10/2014   Lung nodule 10/25/2011   Epigastric pain 08/03/2011   GERD (gastroesophageal reflux disease) 08/03/2011   Past Medical History:  Diagnosis Date   Benign fundic gland polyps of stomach 2013   Cataract    Chronic low back pain    Diverticulosis    DJD (degenerative joint disease)    Gallstones    GERD (gastroesophageal reflux disease)    Hemorrhoids, external    Hiatal hernia    Hx of adenomatous polyp of colon 12/10/2014   Hyperlipidemia    Hypertension    Microhematuria    Osteopenia    Shingles 1980   Urinary, incontinence, stress female     Family History  Problem Relation Age of Onset   Breast cancer Mother    Colon cancer Neg Hx    Esophageal cancer Neg Hx    Rectal cancer Neg Hx    Stomach cancer Neg Hx     Past Surgical History:  Procedure Laterality Date   ABDOMINAL HYSTERECTOMY     BREAST EXCISIONAL BIOPSY Left    BREAST EXCISIONAL BIOPSY Right    COLONOSCOPY     Cyst removed from breasts     age 79   LAPAROSCOPIC CHOLECYSTECTOMY     UPPER GASTROINTESTINAL ENDOSCOPY     fundic gland polyps   Social History   Occupational History   Occupation: Retired   Tobacco Use   Smoking status: Never   Smokeless tobacco: Never  Substance and Sexual Activity   Alcohol use: No    Alcohol/week: 0.0 standard drinks   Drug use: No   Sexual activity: Not on file

## 2020-12-28 ENCOUNTER — Encounter: Payer: Self-pay | Admitting: Orthopedic Surgery

## 2020-12-28 MED ORDER — LIDOCAINE HCL 1 % IJ SOLN
5.0000 mL | INTRAMUSCULAR | Status: AC | PRN
  Administered 2020-12-25: 5 mL

## 2020-12-28 MED ORDER — BUPIVACAINE HCL 0.5 % IJ SOLN
9.0000 mL | INTRAMUSCULAR | Status: AC | PRN
  Administered 2020-12-25: 9 mL via INTRA_ARTICULAR

## 2020-12-28 MED ORDER — METHYLPREDNISOLONE ACETATE 40 MG/ML IJ SUSP
40.0000 mg | INTRAMUSCULAR | Status: AC | PRN
  Administered 2020-12-25: 40 mg via INTRA_ARTICULAR

## 2020-12-29 ENCOUNTER — Ambulatory Visit: Payer: PPO | Admitting: Orthopedic Surgery

## 2021-01-08 ENCOUNTER — Ambulatory Visit: Payer: PPO | Admitting: Orthopedic Surgery

## 2021-02-24 DIAGNOSIS — Z23 Encounter for immunization: Secondary | ICD-10-CM | POA: Diagnosis not present

## 2021-03-11 ENCOUNTER — Encounter: Payer: Self-pay | Admitting: Orthopedic Surgery

## 2021-03-11 ENCOUNTER — Other Ambulatory Visit: Payer: Self-pay

## 2021-03-11 ENCOUNTER — Ambulatory Visit: Payer: PPO | Admitting: Orthopedic Surgery

## 2021-03-11 DIAGNOSIS — M25511 Pain in right shoulder: Secondary | ICD-10-CM | POA: Diagnosis not present

## 2021-03-11 DIAGNOSIS — M7541 Impingement syndrome of right shoulder: Secondary | ICD-10-CM | POA: Diagnosis not present

## 2021-03-15 ENCOUNTER — Encounter: Payer: Self-pay | Admitting: Orthopedic Surgery

## 2021-03-15 MED ORDER — METHYLPREDNISOLONE ACETATE 40 MG/ML IJ SUSP
40.0000 mg | INTRAMUSCULAR | Status: AC | PRN
  Administered 2021-03-11: 40 mg via INTRA_ARTICULAR

## 2021-03-15 MED ORDER — LIDOCAINE HCL 1 % IJ SOLN
5.0000 mL | INTRAMUSCULAR | Status: AC | PRN
  Administered 2021-03-11: 5 mL

## 2021-03-15 MED ORDER — BUPIVACAINE HCL 0.5 % IJ SOLN
9.0000 mL | INTRAMUSCULAR | Status: AC | PRN
  Administered 2021-03-11: 9 mL via INTRA_ARTICULAR

## 2021-03-15 NOTE — Progress Notes (Signed)
Office Visit Note   Patient: Julia Bush           Date of Birth: 1930/03/16           MRN: [id number] Visit Date: 03/11/2021 Requested by: Crist Infante, MD [address] [place],  [place] [address] PCP: Crist Infante, MD  Subjective: Chief Complaint  Patient presents with   Right Shoulder - Follow-up    HPI: Patient presents for evaluation of right shoulder pain.  Had a subacromial injection 12/25/2020 which gave her 50% relief for 2 weeks.  Hurts her with overhead movement.  Hard for her to turn over on the shoulder at night.  Rates the pain at level 8 out of 10.  Hurts for her to get dressed.  She is still driving.  Takes occasional over-the-counter medication for symptoms.              ROS: All systems reviewed are negative as they relate to the chief complaint within the history of present illness.  Patient denies  fevers or chills.   Assessment & Plan: Visit Diagnoses:  1. Impingement syndrome of right shoulder     Plan: Impression is right shoulder pain with possible intra-articular and extra-articular component of pain generation.  Plan is 9 and 1 divided injection today into the joint and into the subacromial space.  Follow-up as needed.  She wants to hold off on any type of surgical intervention.  Follow-Up Instructions: Return if symptoms worsen or fail to improve.   Orders:  No orders of the defined types were placed in this encounter.  No orders of the defined types were placed in this encounter.     Procedures: Large Joint Inj: R glenohumeral on 03/11/2021 12:31 PM Indications: diagnostic evaluation and pain Details: 18 G 1.5 in needle, posterior approach  Arthrogram: No  Medications: 9 mL bupivacaine 0.5 %; 40 mg methylPREDNISolone acetate 40 MG/ML; 5 mL lidocaine 1 % Outcome: tolerated well, no immediate complications Procedure, treatment alternatives, risks and benefits explained, specific risks discussed. Consent was given by the patient.  Immediately prior to procedure a time out was called to verify the correct patient, procedure, equipment, support staff and site/side marked as required. Patient was prepped and draped in the usual sterile fashion.      Clinical Data: No additional findings.  Objective: Vital Signs: There were no vitals taken for this visit.  Physical Exam:   Constitutional: Patient appears well-developed HEENT:  Head: Normocephalic Eyes:EOM are normal Neck: Normal range of motion Cardiovascular: Normal rate Pulmonary/chest: Effort normal Neurologic: Patient is alert Skin: Skin is warm Psychiatric: Patient has normal mood and affect   Ortho Exam: Ortho exam demonstrates good cervical spine range of motion.  Right shoulder has maintained passive range of motion to 50/90/170.  Has pretty reasonable rotator cuff strength demonstrated supraspinatus absent muscle testing with only slight coarseness with internal/external rotation of the right arm.  No discrete AC joint tenderness.  No masses lymphadenopathy or skin changes noted in that right shoulder region.  Specialty Comments:  No specialty comments available.  Imaging: No results found.   PMFS History: Patient Active Problem List   Diagnosis Date Noted   Hx of adenomatous polyp of colon 12/10/2014   Lung nodule 10/25/2011   Epigastric pain 08/03/2011   GERD (gastroesophageal reflux disease) 08/03/2011   Past Medical History:  Diagnosis Date   Benign fundic gland polyps of stomach 2013   Cataract    Chronic low back pain  Diverticulosis    DJD (degenerative joint disease)    Gallstones    GERD (gastroesophageal reflux disease)    Hemorrhoids, external    Hiatal hernia    Hx of adenomatous polyp of colon 12/10/2014   Hyperlipidemia    Hypertension    Microhematuria    Osteopenia    Shingles 1980   Urinary, incontinence, stress female     Family History  Problem Relation Age of Onset   Breast cancer Mother    Colon cancer  Neg Hx    Esophageal cancer Neg Hx    Rectal cancer Neg Hx    Stomach cancer Neg Hx     Past Surgical History:  Procedure Laterality Date   ABDOMINAL HYSTERECTOMY     BREAST EXCISIONAL BIOPSY Left    BREAST EXCISIONAL BIOPSY Right    COLONOSCOPY     Cyst removed from breasts     age 3   LAPAROSCOPIC CHOLECYSTECTOMY     UPPER GASTROINTESTINAL ENDOSCOPY     fundic gland polyps   Social History   Occupational History   Occupation: Retired   Tobacco Use   Smoking status: Never   Smokeless tobacco: Never  Substance and Sexual Activity   Alcohol use: No    Alcohol/week: 0.0 standard drinks   Drug use: No   Sexual activity: Not on file

## 2021-05-08 DIAGNOSIS — E041 Nontoxic single thyroid nodule: Secondary | ICD-10-CM | POA: Diagnosis not present

## 2021-05-08 DIAGNOSIS — I10 Essential (primary) hypertension: Secondary | ICD-10-CM | POA: Diagnosis not present

## 2021-05-08 DIAGNOSIS — M859 Disorder of bone density and structure, unspecified: Secondary | ICD-10-CM | POA: Diagnosis not present

## 2021-05-08 DIAGNOSIS — R7301 Impaired fasting glucose: Secondary | ICD-10-CM | POA: Diagnosis not present

## 2021-05-08 DIAGNOSIS — E785 Hyperlipidemia, unspecified: Secondary | ICD-10-CM | POA: Diagnosis not present

## 2021-05-15 DIAGNOSIS — N393 Stress incontinence (female) (male): Secondary | ICD-10-CM | POA: Diagnosis not present

## 2021-05-15 DIAGNOSIS — Z1339 Encounter for screening examination for other mental health and behavioral disorders: Secondary | ICD-10-CM | POA: Diagnosis not present

## 2021-05-15 DIAGNOSIS — R918 Other nonspecific abnormal finding of lung field: Secondary | ICD-10-CM | POA: Diagnosis not present

## 2021-05-15 DIAGNOSIS — M47896 Other spondylosis, lumbar region: Secondary | ICD-10-CM | POA: Diagnosis not present

## 2021-05-15 DIAGNOSIS — R82998 Other abnormal findings in urine: Secondary | ICD-10-CM | POA: Diagnosis not present

## 2021-05-15 DIAGNOSIS — Z1331 Encounter for screening for depression: Secondary | ICD-10-CM | POA: Diagnosis not present

## 2021-05-15 DIAGNOSIS — M8580 Other specified disorders of bone density and structure, unspecified site: Secondary | ICD-10-CM | POA: Diagnosis not present

## 2021-05-15 DIAGNOSIS — Z0000 Encounter for general adult medical examination without abnormal findings: Secondary | ICD-10-CM | POA: Diagnosis not present

## 2021-05-15 DIAGNOSIS — R7301 Impaired fasting glucose: Secondary | ICD-10-CM | POA: Diagnosis not present

## 2021-05-15 DIAGNOSIS — I10 Essential (primary) hypertension: Secondary | ICD-10-CM | POA: Diagnosis not present

## 2021-05-15 DIAGNOSIS — E785 Hyperlipidemia, unspecified: Secondary | ICD-10-CM | POA: Diagnosis not present

## 2021-06-16 DIAGNOSIS — M8589 Other specified disorders of bone density and structure, multiple sites: Secondary | ICD-10-CM | POA: Diagnosis not present

## 2021-07-28 DIAGNOSIS — H938X2 Other specified disorders of left ear: Secondary | ICD-10-CM | POA: Diagnosis not present

## 2021-07-28 DIAGNOSIS — J343 Hypertrophy of nasal turbinates: Secondary | ICD-10-CM | POA: Diagnosis not present

## 2021-07-28 DIAGNOSIS — H6121 Impacted cerumen, right ear: Secondary | ICD-10-CM | POA: Diagnosis not present

## 2021-09-02 DIAGNOSIS — R3129 Other microscopic hematuria: Secondary | ICD-10-CM | POA: Diagnosis not present

## 2021-09-19 ENCOUNTER — Emergency Department (HOSPITAL_BASED_OUTPATIENT_CLINIC_OR_DEPARTMENT_OTHER)
Admission: EM | Admit: 2021-09-19 | Discharge: 2021-09-19 | Disposition: A | Discharge status: Home / Self Care | Payer: PPO | Attending: Emergency Medicine | Admitting: Emergency Medicine

## 2021-09-19 ENCOUNTER — Other Ambulatory Visit: Payer: Self-pay

## 2021-09-19 ENCOUNTER — Emergency Department (HOSPITAL_BASED_OUTPATIENT_CLINIC_OR_DEPARTMENT_OTHER): Payer: PPO

## 2021-09-19 ENCOUNTER — Encounter (HOSPITAL_BASED_OUTPATIENT_CLINIC_OR_DEPARTMENT_OTHER): Payer: Self-pay | Admitting: *Deleted

## 2021-09-19 ENCOUNTER — Emergency Department (HOSPITAL_BASED_OUTPATIENT_CLINIC_OR_DEPARTMENT_OTHER): Payer: PPO | Admitting: Radiology

## 2021-09-19 DIAGNOSIS — Z79899 Other long term (current) drug therapy: Secondary | ICD-10-CM | POA: Insufficient documentation

## 2021-09-19 DIAGNOSIS — M79641 Pain in right hand: Secondary | ICD-10-CM

## 2021-09-19 DIAGNOSIS — S0240CA Maxillary fracture, right side, initial encounter for closed fracture: Secondary | ICD-10-CM | POA: Diagnosis not present

## 2021-09-19 DIAGNOSIS — S60511A Abrasion of right hand, initial encounter: Secondary | ICD-10-CM | POA: Insufficient documentation

## 2021-09-19 DIAGNOSIS — I10 Essential (primary) hypertension: Secondary | ICD-10-CM | POA: Insufficient documentation

## 2021-09-19 DIAGNOSIS — S8001XA Contusion of right knee, initial encounter: Secondary | ICD-10-CM | POA: Insufficient documentation

## 2021-09-19 DIAGNOSIS — W01198A Fall on same level from slipping, tripping and stumbling with subsequent striking against other object, initial encounter: Secondary | ICD-10-CM | POA: Insufficient documentation

## 2021-09-19 DIAGNOSIS — Z7982 Long term (current) use of aspirin: Secondary | ICD-10-CM | POA: Diagnosis not present

## 2021-09-19 DIAGNOSIS — S02401A Maxillary fracture, unspecified, initial encounter for closed fracture: Secondary | ICD-10-CM | POA: Diagnosis not present

## 2021-09-19 DIAGNOSIS — S01111A Laceration without foreign body of right eyelid and periocular area, initial encounter: Secondary | ICD-10-CM | POA: Diagnosis not present

## 2021-09-19 DIAGNOSIS — M25561 Pain in right knee: Secondary | ICD-10-CM | POA: Diagnosis not present

## 2021-09-19 DIAGNOSIS — Z23 Encounter for immunization: Secondary | ICD-10-CM | POA: Insufficient documentation

## 2021-09-19 DIAGNOSIS — W19XXXA Unspecified fall, initial encounter: Secondary | ICD-10-CM

## 2021-09-19 DIAGNOSIS — Z043 Encounter for examination and observation following other accident: Secondary | ICD-10-CM | POA: Diagnosis not present

## 2021-09-19 DIAGNOSIS — M5020 Other cervical disc displacement, unspecified cervical region: Secondary | ICD-10-CM | POA: Diagnosis not present

## 2021-09-19 DIAGNOSIS — S0993XA Unspecified injury of face, initial encounter: Secondary | ICD-10-CM | POA: Diagnosis present

## 2021-09-19 MED ORDER — TETANUS-DIPHTH-ACELL PERTUSSIS 5-2.5-18.5 LF-MCG/0.5 IM SUSY
0.5000 mL | PREFILLED_SYRINGE | Freq: Once | INTRAMUSCULAR | Status: AC
  Administered 2021-09-19: 0.5 mL via INTRAMUSCULAR
  Filled 2021-09-19: qty 0.5

## 2021-09-19 MED ORDER — ACETAMINOPHEN 325 MG PO TABS
650.0000 mg | ORAL_TABLET | Freq: Once | ORAL | Status: AC
  Administered 2021-09-19: 650 mg via ORAL
  Filled 2021-09-19: qty 2

## 2021-09-19 MED ORDER — LIDOCAINE-EPINEPHRINE-TETRACAINE (LET) TOPICAL GEL
3.0000 mL | Freq: Once | TOPICAL | Status: AC
  Administered 2021-09-19: 3 mL via TOPICAL
  Filled 2021-09-19: qty 3

## 2021-09-19 MED ORDER — LIDOCAINE HCL (PF) 1 % IJ SOLN
10.0000 mL | Freq: Once | INTRAMUSCULAR | Status: AC
  Administered 2021-09-19: 10 mL
  Filled 2021-09-19: qty 10

## 2021-09-19 NOTE — ED Notes (Signed)
Pt ambulated with no difficulty and reported no dizziness and states that she feels fine. ?

## 2021-09-19 NOTE — ED Provider Notes (Signed)
Patient ?[facility] ?Provider Note ? ? ?CSN: [id number] ?Arrival date & time: 09/19/21  1334 ? ?  ? ?History ? ?Chief Complaint  ?Patient presents with  ? Fall  ? ? ?Julia Bush is a 88 y.o. female with a pertinent history of hypertension, degenerative joint disease, osteopenia. ? ?Patient presents to the emergency department with a chief complaint of laceration, facial pain, right hand pain, and right knee pain after suffering a fall.  Patient states the fall occurred approximate 45 minutes prior.  Patient states that fall was mechanical in nature.  Patient describes having her right foot catch on the ground causing her to fall forward.  Patient endorses hitting her head but denies any loss of consciousness.  Patient denies any blood thinner use.  Patient states that she had bleeding to laceration above her right eye for prolonged period of time.  Bleeding was controlled with direct pressure.  Patient was able to stand and ambulate to EMS stretcher.  Patient has not had any medications to help with her pain.  Patient rates pain 2 right cheek at 5/10 on the pain scale.  States that this is the worst pain that she is experiencing.  Patient reports that pain is worse with touch.  Patient is right-hand dominant.  Patient is unsure when her last tetanus shot was. ? ?Patient denies any neck pain, back pain, numbness, weakness, saddle anesthesia, bowel/bladder incontinence, visual disturbance, dental pain, chest pain, palpitations, shortness of breath, abdominal pain, nausea, vomiting, lightheadedness, seizures. ? ? ?Fall ?Pertinent negatives include no chest pain, no abdominal pain, no headaches and no shortness of breath.  ? ?  ? ?Home Medications ?Prior to Admission medications   ?Medication Sig Start Date End Date Taking? Authorizing Provider  ?aspirin 81 MG tablet Take 81 mg by mouth daily.    [provider]  ?Ergocalciferol (VITAMIN D2) 2000 UNITS TABS Take by mouth as  directed.     [provider]  ?hydrochlorothiazide (HYDRODIURIL) 25 MG tablet Take 25 mg by mouth daily.    [provider]  ?KLOR-CON M20 20 MEQ tablet Take 20 mEq by mouth as directed.  08/06/11   [provider]  ?loratadine (CLARITIN) 10 MG tablet Take 10 mg by mouth daily.    [provider]  ?omeprazole (PRILOSEC) 40 MG capsule Will start Omeprazole when done with Nexium 06/29/11   [provider]  ?polyethylene glycol powder (GLYCOLAX/MIRALAX) powder Take 255 g by mouth daily. 10/17/14   Esterwood, Amy S, PA-C  ?pravastatin (PRAVACHOL) 40 MG tablet Take 40 mg by mouth daily.    [provider]  ?zolpidem (AMBIEN) 5 MG tablet 1/2 tap QHS    [provider]  ?   ? ?Allergies    ?Patient has no known allergies.   ? ?Review of Systems   ?Review of Systems  ?Constitutional:  Negative for chills and fever.  ?HENT:  Positive for facial swelling. Negative for dental problem.   ?Eyes:  Negative for visual disturbance.  ?Respiratory:  Negative for shortness of breath.   ?Cardiovascular:  Negative for chest pain.  ?Gastrointestinal:  Negative for abdominal pain, nausea and vomiting.  ?Genitourinary:  Negative for difficulty urinating.  ?Musculoskeletal:  Positive for arthralgias. Negative for back pain, joint swelling and neck pain.  ?Skin:  Positive for wound. Negative for color change, pallor and rash.  ?Neurological:  Negative for dizziness, tremors, seizures, syncope, facial asymmetry, speech difficulty, weakness, light-headedness, numbness and headaches.  ?Psychiatric/Behavioral:  Negative for confusion.   ? ?Physical Exam ?Updated Vital Signs ?BP (!) 146/95 (BP Location: Right Arm)   Pulse 95   Temp 97.9 ?F (36.6 ?C) (Oral)   Resp 16   Wt 54.4 kg   SpO2 98%   BMI 21.26 kg/m?  ?Physical Exam ?Vitals and nursing note reviewed.  ?Constitutional:   ?   General: She is not in acute distress. ?   Appearance: She is not ill-appearing, toxic-appearing or  diaphoretic.  ?HENT:  ?   Head: Normocephalic. Contusion and laceration present. No raccoon eyes, Battle's sign, right periorbital erythema or left periorbital erythema.  ? ?   Comments: 3.7 cm laceration to right eyebrow, hemorrhage controlled.  Ecchymosis surrounding laceration.  Patient has tenderness, contusion and ecchymosis below right eye ?   Mouth/Throat:  ?   Lips: Pink. Lesions present.  ?   Mouth: Mucous membranes are moist. No injury or lacerations.  ?   Tongue: No lesions. Tongue does not deviate from midline.  ?   Palate: No mass and lesions.  ?   Pharynx: Oropharynx is clear. Uvula midline. No pharyngeal swelling, oropharyngeal exudate, posterior oropharyngeal erythema or uvula swelling.  ?Eyes:  ?   General: No scleral icterus.    ?   Right eye: No discharge.     ?   Left eye: No discharge.  ?Cardiovascular:  ?   Rate and Rhythm: Normal rate.  ?Pulmonary:  ?   Effort: Pulmonary effort is normal. No tachypnea or bradypnea.  ?   Breath sounds: No stridor.  ?Chest:  ?   Chest wall: No deformity, tenderness or crepitus.  ?Abdominal:  ?   General: Abdomen is flat. There is no distension. There are no signs of injury.  ?   Palpations: Abdomen is soft. There is no mass or pulsatile mass.  ?   Tenderness: There is no abdominal tenderness. There is no guarding or rebound.  ?Musculoskeletal:  ?   Right shoulder: No swelling, deformity, effusion, laceration, tenderness, bony tenderness or crepitus. Normal range of motion.  ?   Left shoulder: No swelling, deformity, effusion, laceration, tenderness, bony tenderness or crepitus. Normal range of motion.  ?   Right upper arm: Normal.  ?   Left upper arm: Normal.  ?   Right elbow: Normal.  ?   Left elbow: Normal.  ?   Right forearm: Normal.  ?   Left forearm: Normal.  ?   Right wrist: Normal.  ?   Left wrist: Normal.  ?   Right hand: Tenderness and bony tenderness present. No swelling, deformity or lacerations. Normal range of motion. Normal sensation. Normal  capillary refill. Normal pulse.  ?   Left hand: No swelling, deformity, lacerations, tenderness or bony tenderness. Normal range of motion. Normal sensation. Normal capillary refill. Normal pulse.  ?   Cervical back: No swelling, edema, deformity, erythema, signs of trauma, lacerations, rigidity, spasms, torticollis, tenderness, bony tenderness or crepitus. No pain with movement. Normal range of motion.  ?   Thoracic back: No swelling, edema, deformity, signs of trauma, lacerations, spasms, tenderness or bony tenderness.  ?   Lumbar back: No swelling, edema, deformity, signs of trauma, lacerations, spasms, tenderness or bony tenderness.  ?   Right upper leg: Normal.  ?   Left upper leg: Normal.  ?   Right knee: Ecchymosis and bony tenderness present. No swelling, deformity, effusion, erythema, lacerations or crepitus. Normal range of motion. No tenderness. No medial joint line, lateral joint  line, MCL, LCL, ACL, PCL or patellar tendon tenderness. Normal alignment.  ?   Left knee: No swelling, deformity, effusion, erythema, ecchymosis, lacerations, bony tenderness or crepitus. Normal range of motion. No tenderness. Normal alignment.  ?   Right lower leg: Normal.  ?   Left lower leg: Normal.  ?   Right ankle: No swelling, deformity, ecchymosis or lacerations. No tenderness. Normal range of motion.  ?   Left ankle: No swelling, deformity, ecchymosis or lacerations. No tenderness. Normal range of motion.  ?   Right foot: Normal range of motion and normal capillary refill. No swelling, deformity, laceration, tenderness, bony tenderness or crepitus. Normal pulse.  ?   Left foot: Normal range of motion and normal capillary refill. No swelling, deformity, laceration, tenderness, bony tenderness or crepitus. Normal pulse.  ?   Comments: No midline tenderness or deformity to cervical, thoracic, or lumbar spine. ? ?Superficial abrasion to dorsum of right hand below fifth digit.  Patient has tenderness around this area.   Sensation intact to all digits of right hand, cap refill intact to all digits of right hand, patient has full range of motion to all digits of right hand. ? ?Tenderness and ecchymosis overlying right patella.  Full range

## 2021-09-19 NOTE — ED Triage Notes (Signed)
Pt foot caught on carpet and she fell forward striking her face.  Pt has lac to right eyebrow but bleeding controlled.  Pt reports pain in her right eyebrow, cheek, right knee and right hand. Pt denies LOC but states that she was stunned for a second.  Pt is alert and oriented.  ?

## 2021-09-19 NOTE — Discharge Instructions (Addendum)
You came to the emergency department today to be evaluated for your injuries after suffering a fall.  The x-rays of your right knee and right hand did not show any broken bones or dislocations.  The CT scan of your head and neck did not show any abnormalities.  The CT scan of your face did show a subtle nondisplaced, non comminuted fracture of the lateral right ?maxillary sinus wall.  Due to this please call the ENT group listed on your paperwork for follow-up evaluation. ? ?Your laceration required stiches.  Please follow up with your primary care provider, urgent care or return to the emergency department in 5-7 days to have your stitches removed.   ? ?Please keep the wound dry for the next 24 hours.  After that you may gently wash the area with soap and water.  Do not submerge the wound under water until the stiches are removed.   ? ?

## 2021-09-19 NOTE — ED Notes (Signed)
Patient transported to X-ray 

## 2021-09-25 DIAGNOSIS — W010XXA Fall on same level from slipping, tripping and stumbling without subsequent striking against object, initial encounter: Secondary | ICD-10-CM | POA: Diagnosis not present

## 2021-09-25 DIAGNOSIS — S01111A Laceration without foreign body of right eyelid and periocular area, initial encounter: Secondary | ICD-10-CM | POA: Diagnosis not present

## 2021-09-25 DIAGNOSIS — M25561 Pain in right knee: Secondary | ICD-10-CM | POA: Diagnosis not present

## 2021-09-25 DIAGNOSIS — Z4802 Encounter for removal of sutures: Secondary | ICD-10-CM | POA: Diagnosis not present

## 2021-09-25 DIAGNOSIS — S02401A Maxillary fracture, unspecified, initial encounter for closed fracture: Secondary | ICD-10-CM | POA: Diagnosis not present

## 2021-09-30 DIAGNOSIS — S0240CD Maxillary fracture, right side, subsequent encounter for fracture with routine healing: Secondary | ICD-10-CM | POA: Diagnosis not present

## 2021-11-10 DIAGNOSIS — R7301 Impaired fasting glucose: Secondary | ICD-10-CM | POA: Diagnosis not present

## 2021-11-10 DIAGNOSIS — E041 Nontoxic single thyroid nodule: Secondary | ICD-10-CM | POA: Diagnosis not present

## 2021-11-10 DIAGNOSIS — M47896 Other spondylosis, lumbar region: Secondary | ICD-10-CM | POA: Diagnosis not present

## 2021-11-10 DIAGNOSIS — K219 Gastro-esophageal reflux disease without esophagitis: Secondary | ICD-10-CM | POA: Diagnosis not present

## 2021-11-10 DIAGNOSIS — E785 Hyperlipidemia, unspecified: Secondary | ICD-10-CM | POA: Diagnosis not present

## 2021-11-10 DIAGNOSIS — M8580 Other specified disorders of bone density and structure, unspecified site: Secondary | ICD-10-CM | POA: Diagnosis not present

## 2021-11-10 DIAGNOSIS — I10 Essential (primary) hypertension: Secondary | ICD-10-CM | POA: Diagnosis not present

## 2021-11-10 DIAGNOSIS — R918 Other nonspecific abnormal finding of lung field: Secondary | ICD-10-CM | POA: Diagnosis not present

## 2021-11-25 ENCOUNTER — Other Ambulatory Visit (HOSPITAL_COMMUNITY): Payer: Self-pay | Admitting: *Deleted

## 2021-11-26 ENCOUNTER — Ambulatory Visit (HOSPITAL_COMMUNITY)
Admission: RE | Admit: 2021-11-26 | Discharge: 2021-11-26 | Disposition: A | Discharge status: Home / Self Care | Payer: PPO | Source: Ambulatory Visit | Attending: Internal Medicine | Admitting: Internal Medicine

## 2021-11-26 DIAGNOSIS — M810 Age-related osteoporosis without current pathological fracture: Secondary | ICD-10-CM | POA: Diagnosis not present

## 2021-11-26 MED ORDER — ZOLEDRONIC ACID 5 MG/100ML IV SOLN
INTRAVENOUS | Status: AC
  Administered 2021-11-26: 5 mg via INTRAVENOUS
  Filled 2021-11-26: qty 100

## 2021-11-26 MED ORDER — ZOLEDRONIC ACID 5 MG/100ML IV SOLN: 5.0000 mg | Freq: Once | INTRAVENOUS | Status: AC

## 2021-12-04 DIAGNOSIS — H903 Sensorineural hearing loss, bilateral: Secondary | ICD-10-CM | POA: Diagnosis not present

## 2021-12-15 DIAGNOSIS — H903 Sensorineural hearing loss, bilateral: Secondary | ICD-10-CM | POA: Diagnosis not present

## 2022-01-27 DIAGNOSIS — H353131 Nonexudative age-related macular degeneration, bilateral, early dry stage: Secondary | ICD-10-CM | POA: Diagnosis not present

## 2022-01-27 DIAGNOSIS — H02825 Cysts of left lower eyelid: Secondary | ICD-10-CM | POA: Diagnosis not present

## 2022-01-27 DIAGNOSIS — H35033 Hypertensive retinopathy, bilateral: Secondary | ICD-10-CM | POA: Diagnosis not present

## 2022-01-27 DIAGNOSIS — Z961 Presence of intraocular lens: Secondary | ICD-10-CM | POA: Diagnosis not present

## 2022-03-29 DIAGNOSIS — H6121 Impacted cerumen, right ear: Secondary | ICD-10-CM | POA: Diagnosis not present

## 2022-03-29 DIAGNOSIS — Z974 Presence of external hearing-aid: Secondary | ICD-10-CM | POA: Diagnosis not present

## 2022-05-19 ENCOUNTER — Ambulatory Visit: Payer: PPO | Admitting: Surgical

## 2022-06-02 DIAGNOSIS — R7301 Impaired fasting glucose: Secondary | ICD-10-CM | POA: Diagnosis not present

## 2022-06-02 DIAGNOSIS — E041 Nontoxic single thyroid nodule: Secondary | ICD-10-CM | POA: Diagnosis not present

## 2022-06-02 DIAGNOSIS — D509 Iron deficiency anemia, unspecified: Secondary | ICD-10-CM | POA: Diagnosis not present

## 2022-06-02 DIAGNOSIS — I10 Essential (primary) hypertension: Secondary | ICD-10-CM | POA: Diagnosis not present

## 2022-06-02 DIAGNOSIS — M8580 Other specified disorders of bone density and structure, unspecified site: Secondary | ICD-10-CM | POA: Diagnosis not present

## 2022-06-02 DIAGNOSIS — E785 Hyperlipidemia, unspecified: Secondary | ICD-10-CM | POA: Diagnosis not present

## 2022-06-08 DIAGNOSIS — Z0000 Encounter for general adult medical examination without abnormal findings: Secondary | ICD-10-CM | POA: Diagnosis not present

## 2022-06-08 DIAGNOSIS — R7301 Impaired fasting glucose: Secondary | ICD-10-CM | POA: Diagnosis not present

## 2022-06-08 DIAGNOSIS — M8580 Other specified disorders of bone density and structure, unspecified site: Secondary | ICD-10-CM | POA: Diagnosis not present

## 2022-06-08 DIAGNOSIS — I10 Essential (primary) hypertension: Secondary | ICD-10-CM | POA: Diagnosis not present

## 2022-06-08 DIAGNOSIS — R918 Other nonspecific abnormal finding of lung field: Secondary | ICD-10-CM | POA: Diagnosis not present

## 2022-06-08 DIAGNOSIS — R3121 Asymptomatic microscopic hematuria: Secondary | ICD-10-CM | POA: Diagnosis not present

## 2022-06-08 DIAGNOSIS — N393 Stress incontinence (female) (male): Secondary | ICD-10-CM | POA: Diagnosis not present

## 2022-06-08 DIAGNOSIS — M47896 Other spondylosis, lumbar region: Secondary | ICD-10-CM | POA: Diagnosis not present

## 2022-06-08 DIAGNOSIS — R82998 Other abnormal findings in urine: Secondary | ICD-10-CM | POA: Diagnosis not present

## 2022-06-08 DIAGNOSIS — Z1331 Encounter for screening for depression: Secondary | ICD-10-CM | POA: Diagnosis not present

## 2022-06-08 DIAGNOSIS — E041 Nontoxic single thyroid nodule: Secondary | ICD-10-CM | POA: Diagnosis not present

## 2022-06-08 DIAGNOSIS — E785 Hyperlipidemia, unspecified: Secondary | ICD-10-CM | POA: Diagnosis not present

## 2022-06-08 DIAGNOSIS — Z1339 Encounter for screening examination for other mental health and behavioral disorders: Secondary | ICD-10-CM | POA: Diagnosis not present

## 2023-02-28 DIAGNOSIS — Z23 Encounter for immunization: Secondary | ICD-10-CM | POA: Diagnosis not present

## 2023-02-28 DIAGNOSIS — K219 Gastro-esophageal reflux disease without esophagitis: Secondary | ICD-10-CM | POA: Diagnosis not present

## 2023-02-28 DIAGNOSIS — R3121 Asymptomatic microscopic hematuria: Secondary | ICD-10-CM | POA: Diagnosis not present

## 2023-02-28 DIAGNOSIS — E785 Hyperlipidemia, unspecified: Secondary | ICD-10-CM | POA: Diagnosis not present

## 2023-02-28 DIAGNOSIS — M8580 Other specified disorders of bone density and structure, unspecified site: Secondary | ICD-10-CM | POA: Diagnosis not present

## 2023-02-28 DIAGNOSIS — H9319 Tinnitus, unspecified ear: Secondary | ICD-10-CM | POA: Diagnosis not present

## 2023-02-28 DIAGNOSIS — R918 Other nonspecific abnormal finding of lung field: Secondary | ICD-10-CM | POA: Diagnosis not present

## 2023-02-28 DIAGNOSIS — I10 Essential (primary) hypertension: Secondary | ICD-10-CM | POA: Diagnosis not present

## 2023-02-28 DIAGNOSIS — N393 Stress incontinence (female) (male): Secondary | ICD-10-CM | POA: Diagnosis not present

## 2023-02-28 DIAGNOSIS — M47896 Other spondylosis, lumbar region: Secondary | ICD-10-CM | POA: Diagnosis not present

## 2023-02-28 DIAGNOSIS — E041 Nontoxic single thyroid nodule: Secondary | ICD-10-CM | POA: Diagnosis not present

## 2023-02-28 DIAGNOSIS — R7301 Impaired fasting glucose: Secondary | ICD-10-CM | POA: Diagnosis not present

## 2023-08-09 DIAGNOSIS — E041 Nontoxic single thyroid nodule: Secondary | ICD-10-CM | POA: Diagnosis not present

## 2023-08-09 DIAGNOSIS — I10 Essential (primary) hypertension: Secondary | ICD-10-CM | POA: Diagnosis not present

## 2023-08-09 DIAGNOSIS — R7301 Impaired fasting glucose: Secondary | ICD-10-CM | POA: Diagnosis not present

## 2023-08-09 DIAGNOSIS — E039 Hypothyroidism, unspecified: Secondary | ICD-10-CM | POA: Diagnosis not present

## 2023-08-09 DIAGNOSIS — E559 Vitamin D deficiency, unspecified: Secondary | ICD-10-CM | POA: Diagnosis not present

## 2023-08-09 DIAGNOSIS — D509 Iron deficiency anemia, unspecified: Secondary | ICD-10-CM | POA: Diagnosis not present

## 2023-08-09 DIAGNOSIS — E785 Hyperlipidemia, unspecified: Secondary | ICD-10-CM | POA: Diagnosis not present

## 2023-08-16 DIAGNOSIS — R7301 Impaired fasting glucose: Secondary | ICD-10-CM | POA: Diagnosis not present

## 2023-08-16 DIAGNOSIS — Z0000 Encounter for general adult medical examination without abnormal findings: Secondary | ICD-10-CM | POA: Diagnosis not present

## 2023-08-16 DIAGNOSIS — R413 Other amnesia: Secondary | ICD-10-CM | POA: Diagnosis not present

## 2023-08-16 DIAGNOSIS — R82998 Other abnormal findings in urine: Secondary | ICD-10-CM | POA: Diagnosis not present

## 2023-08-16 DIAGNOSIS — E785 Hyperlipidemia, unspecified: Secondary | ICD-10-CM | POA: Diagnosis not present

## 2023-08-16 DIAGNOSIS — R918 Other nonspecific abnormal finding of lung field: Secondary | ICD-10-CM | POA: Diagnosis not present

## 2023-08-16 DIAGNOSIS — I10 Essential (primary) hypertension: Secondary | ICD-10-CM | POA: Diagnosis not present

## 2023-08-16 DIAGNOSIS — E041 Nontoxic single thyroid nodule: Secondary | ICD-10-CM | POA: Diagnosis not present

## 2023-08-16 DIAGNOSIS — K219 Gastro-esophageal reflux disease without esophagitis: Secondary | ICD-10-CM | POA: Diagnosis not present

## 2023-08-16 DIAGNOSIS — M8580 Other specified disorders of bone density and structure, unspecified site: Secondary | ICD-10-CM | POA: Diagnosis not present

## 2023-08-16 DIAGNOSIS — N393 Stress incontinence (female) (male): Secondary | ICD-10-CM | POA: Diagnosis not present

## 2023-08-16 DIAGNOSIS — M47896 Other spondylosis, lumbar region: Secondary | ICD-10-CM | POA: Diagnosis not present

## 2023-08-16 DIAGNOSIS — D509 Iron deficiency anemia, unspecified: Secondary | ICD-10-CM | POA: Diagnosis not present

## 2023-09-01 DIAGNOSIS — R82998 Other abnormal findings in urine: Secondary | ICD-10-CM | POA: Diagnosis not present

## 2023-10-11 DIAGNOSIS — M8589 Other specified disorders of bone density and structure, multiple sites: Secondary | ICD-10-CM | POA: Diagnosis not present

## 2023-10-11 DIAGNOSIS — I10 Essential (primary) hypertension: Secondary | ICD-10-CM | POA: Diagnosis not present

## 2023-10-11 DIAGNOSIS — E785 Hyperlipidemia, unspecified: Secondary | ICD-10-CM | POA: Diagnosis not present

## 2023-10-11 DIAGNOSIS — R413 Other amnesia: Secondary | ICD-10-CM | POA: Diagnosis not present

## 2023-10-19 DIAGNOSIS — M8580 Other specified disorders of bone density and structure, unspecified site: Secondary | ICD-10-CM | POA: Diagnosis not present

## 2023-10-19 DIAGNOSIS — I10 Essential (primary) hypertension: Secondary | ICD-10-CM | POA: Diagnosis not present

## 2023-10-21 ENCOUNTER — Other Ambulatory Visit (INDEPENDENT_AMBULATORY_CARE_PROVIDER_SITE_OTHER): Payer: Self-pay

## 2023-10-21 ENCOUNTER — Ambulatory Visit: Admitting: Surgical

## 2023-10-21 DIAGNOSIS — M545 Low back pain, unspecified: Secondary | ICD-10-CM

## 2023-10-23 ENCOUNTER — Encounter: Payer: Self-pay | Admitting: Surgical

## 2023-10-23 NOTE — Progress Notes (Signed)
 Office Visit Note   Patient: Julia Bush           Date of Birth: 09-03-1929           MRN: [id number] Visit Date: 10/21/2023 Requested by: Aldo Hun, MD [address] [place],  [place] [address] PCP: Aldo Hun, MD  Subjective: Chief Complaint  Patient presents with   Lower Back - Pain    HPI: Julia Bush is a 89 y.o. female who presents to the office reporting right hip and low back pain.  Patient reports that over the last month she has noticed a catching sensation with spasm in the right low back and right buttock region particularly with bending over.  Has also noticed it 1 time with twisting to the side.  She has never had the symptoms before.  Has had about 3 such events of symptoms over the last month.  No history of prior hip or back surgery.  She has occasional right leg radicular symptoms that extend down to the knee but this does not really concern her.  No groin pain.  Most of her pain is localized to the top of the buttock.  She has occasional low back pain that is chronic for her but nothing worse than normal typically aside from when she has these catching sensations.  When this happens, she has difficulty standing up straight without pain and stiffness and this lasts for anywhere from several minutes to an hour or 2.  Not taking any medications for pain..                ROS: All systems reviewed are negative as they relate to the chief complaint within the history of present illness.  Patient denies fevers or chills.  Assessment & Plan: Visit Diagnoses:  1. Acute right-sided low back pain, unspecified whether sciatica present     Plan: Patient is a 89 year old female who presents for evaluation of right-sided low back pain.  She has catching sensation and spasm at times with bending over.  On exam, the majority of her pain actually localizes to the right SI joint.  We discussed options available to patient such as diagnostic SI joint injection  versus MRI scan of the low back for further evaluation of disc pathology and facet joint arthritis.  Patient would like to try SI joint injection first; this will be set up for her with Dr. Vaughn Georges.  If little to no improvement, next step would be MRI of the lumbar spine.  Patient agreed with plan.  Follow-up after injection.  Follow-Up Instructions: No follow-ups on file.   Orders:  Orders Placed This Encounter  Procedures   XR Pelvis 1-2 Views   XR Lumbar Spine 2-3 Views   No orders of the defined types were placed in this encounter.     Procedures: No procedures performed   Clinical Data: No additional findings.  Objective: Vital Signs: There were no vitals taken for this visit.  Physical Exam:  Constitutional: Patient appears well-developed HEENT:  Head: Normocephalic Eyes:EOM are normal Neck: Normal range of motion Cardiovascular: Normal rate Pulmonary/chest: Effort normal Neurologic: Patient is alert Skin: Skin is warm Psychiatric: Patient has normal mood and affect  Ortho Exam: Ortho exam demonstrates right leg with negative straight leg raise.  She has really no significant tenderness throughout the axial lumbar spine or right-sided paraspinal musculature.  She has no tenderness over the left SI joint but she does have moderate tenderness over the right SI  joint.  No pain with hip range of motion.  Negative FADIR sign.  She has intact hip flexion, quadricep, hamstring, dorsiflexion, plantarflexion strength rated 5/5 bilaterally.  No clonus noted bilaterally.  Specialty Comments:  No specialty comments available.  Imaging: No results found.   PMFS History: Patient Active Problem List   Diagnosis Date Noted   Hx of adenomatous polyp of colon 12/10/2014   Lung nodule 10/25/2011   Epigastric pain 08/03/2011   GERD (gastroesophageal reflux disease) 08/03/2011   Past Medical History:  Diagnosis Date   Benign fundic gland polyps of stomach 2013   Cataract     Chronic low back pain    Diverticulosis    DJD (degenerative joint disease)    Gallstones    GERD (gastroesophageal reflux disease)    Hemorrhoids, external    Hiatal hernia    Hx of adenomatous polyp of colon 12/10/2014   Hyperlipidemia    Hypertension    Microhematuria    Osteopenia    Shingles 1980   Urinary, incontinence, stress female     Family History  Problem Relation Age of Onset   Breast cancer Mother    Colon cancer Neg Hx    Esophageal cancer Neg Hx    Rectal cancer Neg Hx    Stomach cancer Neg Hx     Past Surgical History:  Procedure Laterality Date   ABDOMINAL HYSTERECTOMY     BREAST EXCISIONAL BIOPSY Left    BREAST EXCISIONAL BIOPSY Right    COLONOSCOPY     Cyst removed from breasts     age 85   LAPAROSCOPIC CHOLECYSTECTOMY     UPPER GASTROINTESTINAL ENDOSCOPY     fundic gland polyps   Social History   Occupational History   Occupation: Retired   Tobacco Use   Smoking status: Never   Smokeless tobacco: Never  Substance and Sexual Activity   Alcohol  use: No    Alcohol /week: 0.0 standard drinks of alcohol    Drug use: No   Sexual activity: Not on file

## 2023-11-03 ENCOUNTER — Other Ambulatory Visit (HOSPITAL_COMMUNITY): Payer: Self-pay | Admitting: *Deleted

## 2023-11-04 ENCOUNTER — Ambulatory Visit (HOSPITAL_COMMUNITY)
Admission: RE | Admit: 2023-11-04 | Discharge: 2023-11-04 | Disposition: A | Discharge status: Home / Self Care | Source: Ambulatory Visit | Attending: Internal Medicine | Admitting: Internal Medicine

## 2023-11-04 DIAGNOSIS — M810 Age-related osteoporosis without current pathological fracture: Secondary | ICD-10-CM | POA: Insufficient documentation

## 2023-11-04 MED ORDER — ZOLEDRONIC ACID 5 MG/100ML IV SOLN
5.0000 mg | Freq: Once | INTRAVENOUS | Status: AC
  Administered 2023-11-04: 5 mg via INTRAVENOUS

## 2023-11-04 MED ORDER — ZOLEDRONIC ACID 5 MG/100ML IV SOLN
INTRAVENOUS | Status: AC
  Filled 2023-11-04: qty 100

## 2023-11-07 ENCOUNTER — Ambulatory Visit: Admitting: Sports Medicine

## 2024-04-26 ENCOUNTER — Ambulatory Visit: Admitting: Surgical

## 2024-05-01 DIAGNOSIS — D509 Iron deficiency anemia, unspecified: Secondary | ICD-10-CM | POA: Diagnosis not present
